# Patient Record
Sex: Male | Born: 1955 | Race: White | Hispanic: No | Marital: Married | Smoking: Never smoker
Health system: Southern US, Community
[De-identification: ages and names within clinical notes are randomized; demographics above are authoritative.]

## PROBLEM LIST (undated history)

## (undated) DIAGNOSIS — Z8719 Personal history of other diseases of the digestive system: Secondary | ICD-10-CM

## (undated) DIAGNOSIS — E119 Type 2 diabetes mellitus without complications: Secondary | ICD-10-CM

## (undated) DIAGNOSIS — E785 Hyperlipidemia, unspecified: Secondary | ICD-10-CM

## (undated) DIAGNOSIS — K436 Other and unspecified ventral hernia with obstruction, without gangrene: Secondary | ICD-10-CM

## (undated) DIAGNOSIS — G4730 Sleep apnea, unspecified: Secondary | ICD-10-CM

## (undated) DIAGNOSIS — I10 Essential (primary) hypertension: Secondary | ICD-10-CM

## (undated) DIAGNOSIS — F329 Major depressive disorder, single episode, unspecified: Secondary | ICD-10-CM

## (undated) DIAGNOSIS — F32A Depression, unspecified: Secondary | ICD-10-CM

## (undated) HISTORY — PX: SHOULDER ARTHROSCOPY: SHX128

## (undated) HISTORY — PX: HERNIA REPAIR: SHX51

## (undated) HISTORY — PX: GASTRIC BYPASS: SHX52

---

## 1998-06-22 ENCOUNTER — Encounter: Admission: RE | Admit: 1998-06-22 | Discharge: 1998-09-20 | Payer: Self-pay | Admitting: Internal Medicine

## 2000-11-14 ENCOUNTER — Emergency Department (HOSPITAL_COMMUNITY): Admission: EM | Admit: 2000-11-14 | Discharge: 2000-11-15 | Payer: Self-pay | Admitting: Emergency Medicine

## 2000-11-15 ENCOUNTER — Encounter: Payer: Self-pay | Admitting: Emergency Medicine

## 2002-11-22 ENCOUNTER — Encounter: Payer: Self-pay | Admitting: Internal Medicine

## 2002-11-22 ENCOUNTER — Encounter: Admission: RE | Admit: 2002-11-22 | Discharge: 2002-11-22 | Payer: Self-pay | Admitting: Internal Medicine

## 2003-12-12 ENCOUNTER — Ambulatory Visit (HOSPITAL_BASED_OUTPATIENT_CLINIC_OR_DEPARTMENT_OTHER): Admission: RE | Admit: 2003-12-12 | Discharge: 2003-12-12 | Payer: Self-pay | Admitting: Internal Medicine

## 2004-10-11 ENCOUNTER — Ambulatory Visit (HOSPITAL_COMMUNITY): Admission: RE | Admit: 2004-10-11 | Discharge: 2004-10-11 | Payer: Self-pay | Admitting: Internal Medicine

## 2006-01-07 ENCOUNTER — Ambulatory Visit (HOSPITAL_COMMUNITY): Admission: RE | Admit: 2006-01-07 | Discharge: 2006-01-07 | Payer: Self-pay | Admitting: Internal Medicine

## 2006-01-07 ENCOUNTER — Encounter (INDEPENDENT_AMBULATORY_CARE_PROVIDER_SITE_OTHER): Payer: Self-pay | Admitting: *Deleted

## 2007-04-06 ENCOUNTER — Encounter: Admission: RE | Admit: 2007-04-06 | Discharge: 2007-04-06 | Payer: Self-pay | Admitting: Internal Medicine

## 2008-02-03 ENCOUNTER — Encounter: Admission: RE | Admit: 2008-02-03 | Discharge: 2008-02-03 | Payer: Self-pay | Admitting: Otolaryngology

## 2008-02-03 IMAGING — CT CT NECK W/ CM
3 of 4 series · 16 of 33 positions shown, 19 images · IV contrast ([ID] OMNI [id number])
Comparison: None available.

CLINICAL DATA: Neck mass.

CT NECK WITH CONTRAST
TECHNIQUE: Multidetector CT imaging of the neck was performed with
intravenous contrast.
Contrast: 100 ml Omnipaque 300

[Series 3: soft tissue neck · axial · 0.49mm/px · z∈[+33,+217]mm · 8 of 63 slices shown, 10 images]
[im 7/63  soft-tissue]
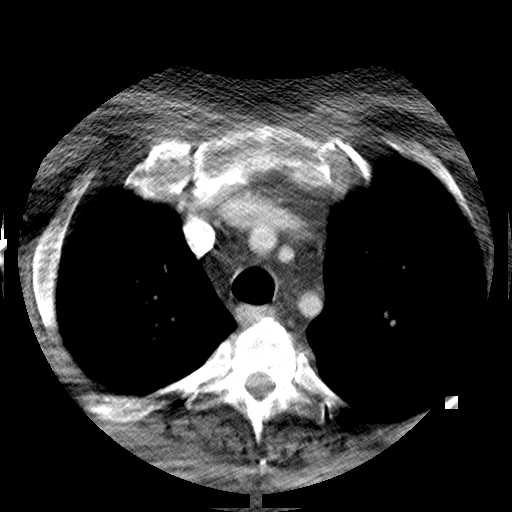
[im 7/63  bone]
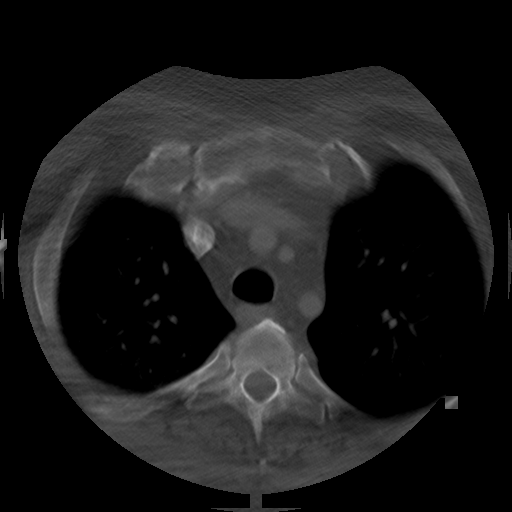
[im 14/63  bone]
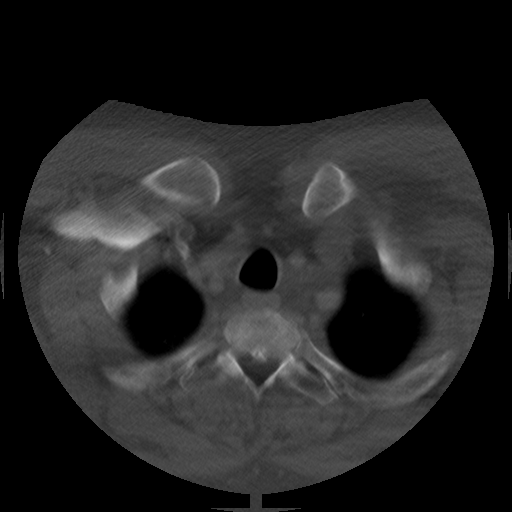
[im 21/63  bone]
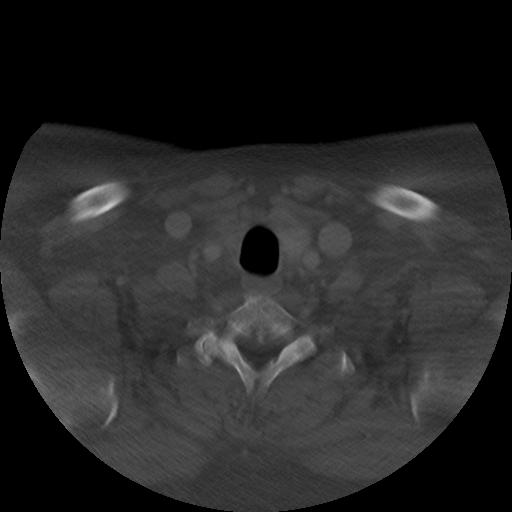
[im 28/63  bone]
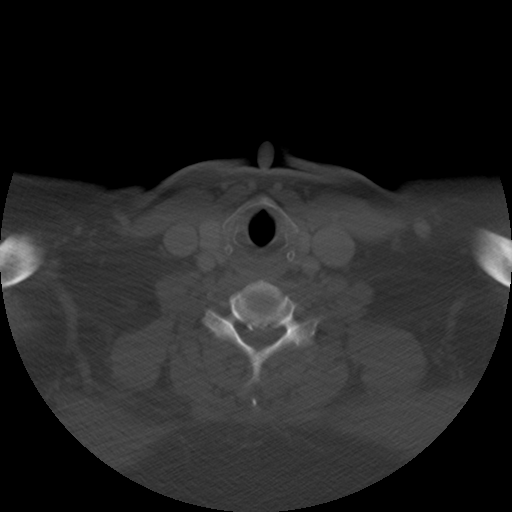
[im 35/63  soft-tissue]
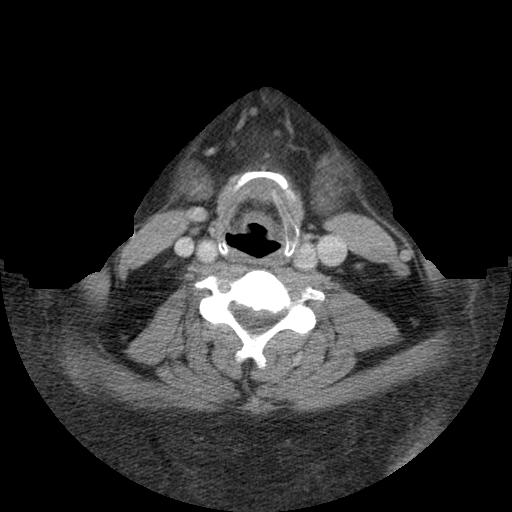
[im 35/63  bone]
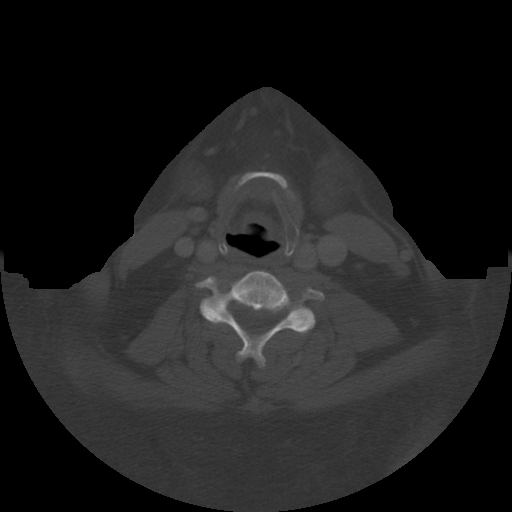
[im 42/63  bone]
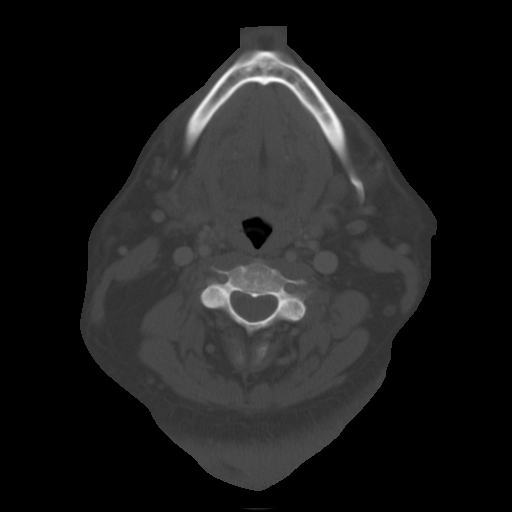
[im 49/63  bone]
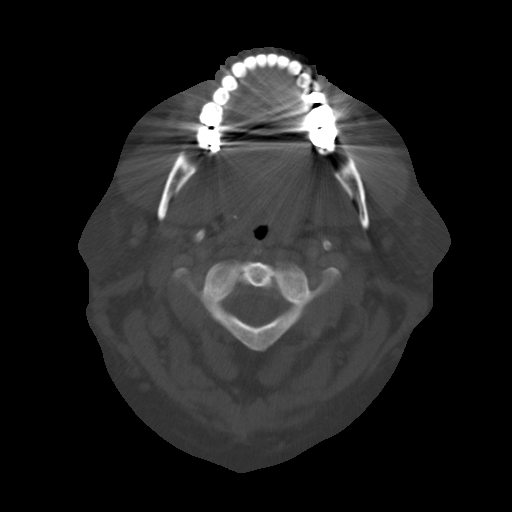
[im 56/63  bone]
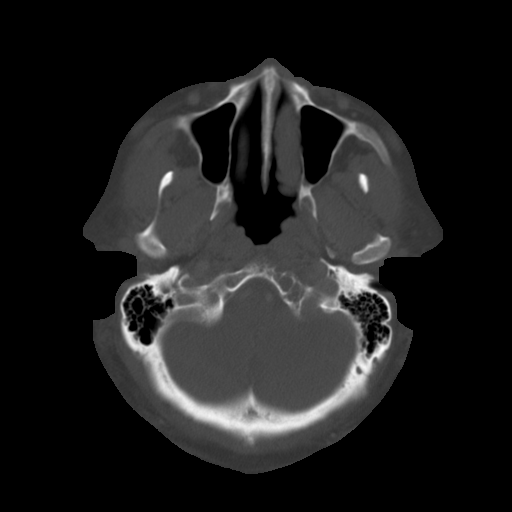

[Series 200: coronal · coronal · 0.49mm/px · 3 of 88 slices shown]
[im 22/88  bone]
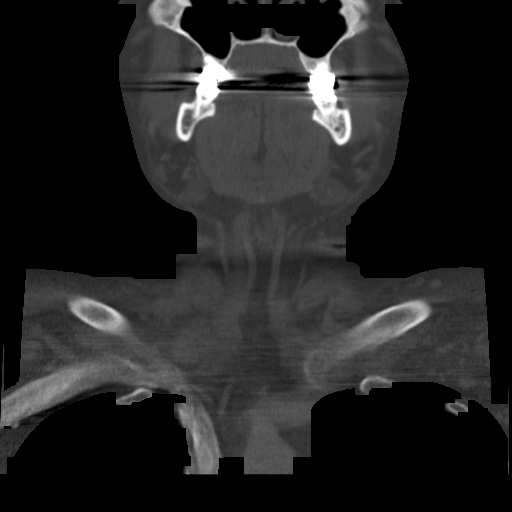
[im 37/88  bone]
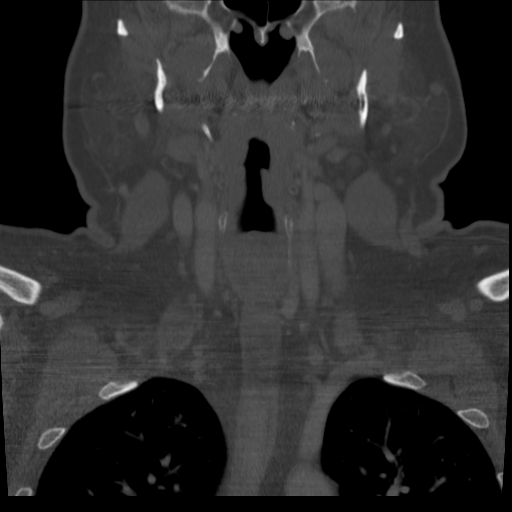
[im 51/88  bone]
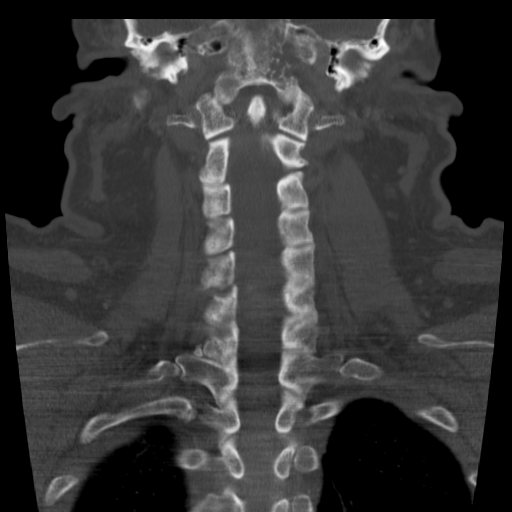

[Series 201: sagittal · sagittal · 0.49mm/px · 5 of 100 slices shown, 6 images]
[im 34/100  bone]
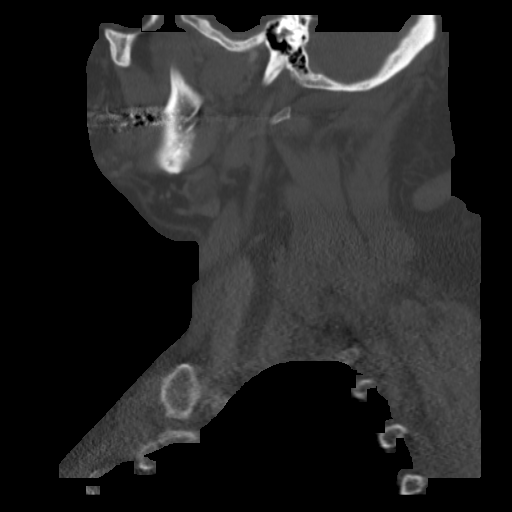
[im 42/100  bone]
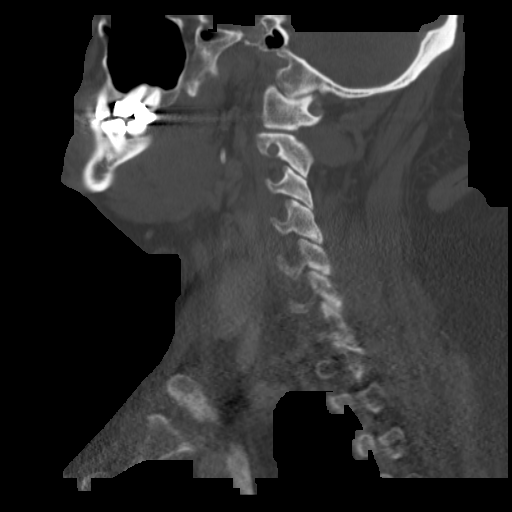
[im 50/100  soft-tissue]
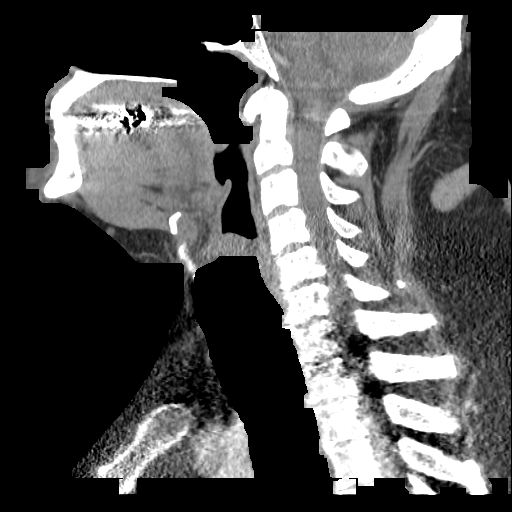
[im 50/100  bone]
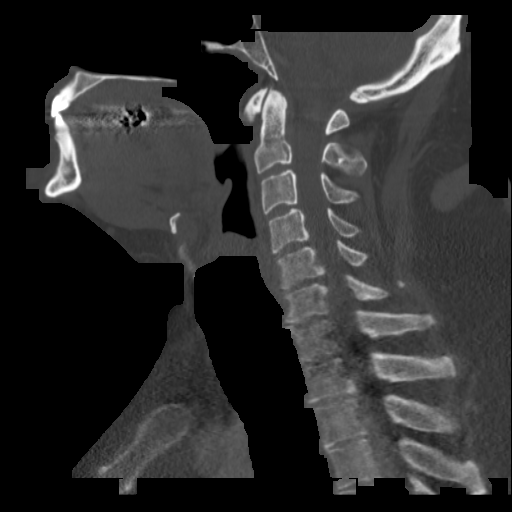
[im 58/100  bone]
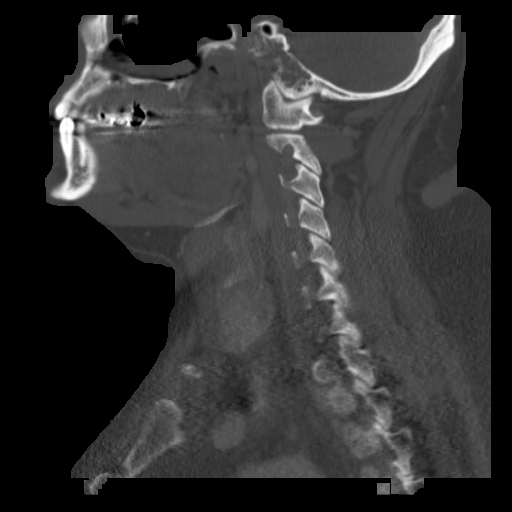
[im 67/100  bone]
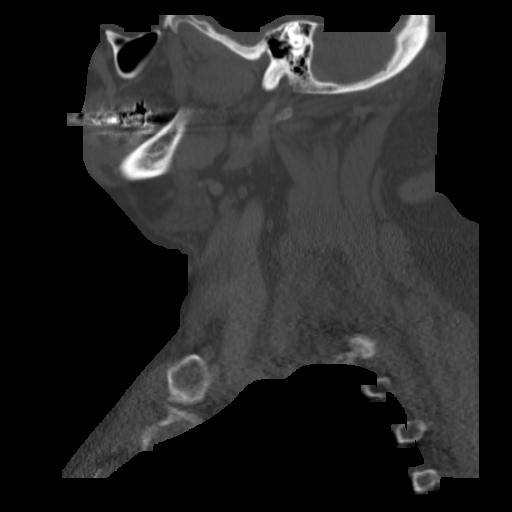

[16 of 33 positions shown; findings below may reference images not displayed]

FINDINGS: Limited imaging of the brain is unremarkable.  There is
mild prominence of the palatine tonsils with some calcifications on
the right suggesting remote disease.  No significant mucosal or
submucosal lesions are present.  The vocal cords are midline.  The
thyroid gland is within normal limits.

No pathologically enlarged lymph nodes are present in the neck.  A
2.0 x 1.6 x 1.5 centimeter fluid attenuation cystic mass is present
to just anterior and to the left of midline relative to the thyroid
cartilage.  There is no significant wall enhancement or surrounding
inflammation.  The findings are compatible with a thyroglossal duct
cyst.  Limited imaging of the lung apices is unremarkable.  Bone
windows demonstrate mild degenerative endplate changes most evident
C5-6 through T1-2. No focal lytic or blastic lesion is evident.
IMPRESSION: 1.  Left paramidline 2 cm benign appearing cystic mass at the level
of the thyroid cartilage is most compatible with a thyroglossal
duct cyst.  There are no complicating features.
2.  Mild prominence of the palatine tonsils without discrete mass.
These areas should be amendable to direct visualization.
3.  Cervical spondylosis from C5-T2.

## 2008-05-24 ENCOUNTER — Ambulatory Visit (HOSPITAL_COMMUNITY): Admission: RE | Admit: 2008-05-24 | Discharge: 2008-05-25 | Payer: Self-pay | Admitting: Otolaryngology

## 2008-05-24 ENCOUNTER — Encounter (INDEPENDENT_AMBULATORY_CARE_PROVIDER_SITE_OTHER): Payer: Self-pay | Admitting: Otolaryngology

## 2010-05-22 LAB — GLUCOSE, CAPILLARY
Glucose-Capillary: 112 mg/dL — ABNORMAL HIGH (ref 70–99)
Glucose-Capillary: 117 mg/dL — ABNORMAL HIGH (ref 70–99)
Glucose-Capillary: 240 mg/dL — ABNORMAL HIGH (ref 70–99)

## 2010-05-22 LAB — CBC
HCT: 42.1 % (ref 39.0–52.0)
Platelets: 204 10*3/uL (ref 150–400)
RBC: 4.79 MIL/uL (ref 4.22–5.81)
WBC: 8.4 10*3/uL (ref 4.0–10.5)

## 2010-05-22 LAB — BASIC METABOLIC PANEL
BUN: 12 mg/dL (ref 6–23)
Chloride: 106 mEq/L (ref 96–112)
GFR calc Af Amer: >60 mL/min (ref >60)
GFR calc non Af Amer: >60 mL/min (ref >60)
Potassium: 4.1 mEq/L (ref 3.5–5.1)
Sodium: 140 mEq/L (ref 135–145)

## 2010-06-25 NOTE — Op Note (Signed)
Gavin Sweeney, Gavin Sweeney                ACCOUNT NO.:  [id number]   MEDICAL RECORD NO.:  [id number]          PATIENT TYPE:  AMB   LOCATION:  SDS                          FACILITY:  [facility]   PHYSICIAN:  Kinnie Scales. Annalee Genta, M.D.DATE OF BIRTH:  1955-05-09   DATE OF PROCEDURE:  05/24/2008  DATE OF DISCHARGE:                               OPERATIVE REPORT   PREOPERATIVE DIAGNOSES:  1. Anterior neck mass.  2. Obstructive sleep apnea.  3. Morbid obesity.   POSTOPERATIVE DIAGNOSES:  1. Anterior neck mass.  2. Obstructive sleep apnea.  3. Morbid obesity.   INDICATIONS FOR SURGERY:  1. Anterior neck mass.  2. Obstructive sleep apnea.  3. Morbid obesity.   SURGICAL PROCEDURE:  Excision, anterior deep neck mass.   SURGEON:  Kinnie Scales. Annalee Genta, MD   ASSISTANTEnrigue Catena H. Pollyann Kennedy, MD   ANESTHESIA:  General endotracheal.   COMPLICATIONS:  None.   SPECIMENS TO PATHOLOGY:  Minimal bleeding.   DISPOSITION:  The patient transferred from the operating room to the  recovery room incision in stable condition.   BRIEF HISTORY:  The patient is a 55 year old white male with a history  of morbid obesity.  He underwent bariatric bypass surgery approximately  1 year prior to his surgical procedure and he has lost over 140 pounds.  Over time, the patient has noted an anterior neck mass which is  asymptomatic and nontender.  Evaluation in the office revealed a 3-cm  firm mass in the anterior neck adjacent to the patient's larynx.  A CT  scan was obtained which showed a heterogeneous noncystic mass involving  the anterior neck.  Findings were consistent with possible deep neck  cyst versus thyroglossal duct cyst.  Given the patient's history,  examination, and physical findings, I recommended that we undertake the  excision of the mass under general anesthesia.  The risks, benefits, and  possible complications of procedure were discussed in detail and  included the possibility of excision of the hyoid  bone for management of  thyroglossal duct cyst.  Also with the patient's obesity and sleep  apnea, I recommended inpatient overnight observation in Step-Down for  possible exacerbation of sleep apnea.  The patient and his wife  understood and concurred with our plan for surgery which was scheduled  under general anesthesia at [facility] on May 24, 2008.   PROCEDURE IN DETAIL:  The patient was brought to the operating room and  placed in supine position on the operating table.  General endotracheal  anesthesia was established without difficulty.  When the patient was  adequately anesthetized, he was positioned on the operating table and  then prepped and draped in a sterile fashion.  The skin was injected  with a total of 3 mL of 1% lidocaine 1:100,000 dilution injected in  subcutaneous fashion in the proposed skin incision.  The patient was  then positioned and a shoulder roll was placed.  The anterior neck was  thoroughly evaluated and CT scan was reviewed.   With the patient prepared for surgery, a 3-cm horizontally oriented  incision was made in  a preexisting skin crease in the soft tissue  overlying the mass.  There appeared to be a skin attachment of the mass  and this was excised in an elliptical incision and included in the  primary surgical excision.  The incision was created with a #15 scalpel  and was carried through the skin and underlying subcutaneous tissue.  Subcutaneous flaps were then elevated.  The mass was identified in the  immediate subcutaneous space.  There was significant inflammatory change  with dense adhesions which were carefully dissected from the surrounding  soft tissue.  The strap muscles were then identified and lateralized  allowing access to the anterior compartment of the neck.  The mass was  then carefully dissected from the surrounding structures including the  anterior surface of the laryngeal cartilage.  The entire mass was  resected.   There did not appear to be any cystic extension either  superiorly or inferiorly and the mass was sent to pathology for gross  and microscopic evaluation.  The wound defect was then thoroughly  irrigated with saline solution and closed in multiple layers beginning  with reapproximation of the strap muscles in the midline with a 4-0  Vicryl suture in an interrupted fashion.  Deep subcutaneous and platysma  closure was achieved with 5-0 Vicryl in interrupted fashion and final  skin closure was achieved with a 5-0 Ethilon suture in a running locked  suture.  The patient's wound was then dressed with bacitracin ointment.  He was awakened from his anesthetic, extubated, and was then transferred  from the operating room to the recovery room in stable condition.  There  were no complications and blood loss was minimal.           ______________________________  Kinnie Scales. Annalee Genta, M.D.     DLS/MEDQ  D:  16/11/9602  T:  05/25/2008  Job:  [id number]

## 2010-06-28 NOTE — Procedures (Signed)
NAMETAYLER, Gavin Sweeney                ACCOUNT NO.:  [id number]   MEDICAL RECORD NO.:  [id number]          PATIENT TYPE:  OUT   LOCATION:  SLEEP CENTER                 FACILITY:  [facility]   PHYSICIAN:  Clinton D. Maple Hudson, M.D. DATE OF BIRTH:  1955/06/05   DATE OF STUDY:  12/12/2003                              NOCTURNAL POLYSOMNOGRAM   REFERRING PHYSICIAN:  Dr. Kirby Funk   INDICATION FOR STUDY:  Hypersomnia with sleep apnea.   EPWORTH SLEEPINESS SCORE:  11/24   NECK SIZE:  22 inches   BMI:  52.   WEIGHT:  385 pounds   SLEEP ARCHITECTURE:  Total sleep time 369 minutes with sleep efficiency 82%  . Stage I was 12%, Stage II 67%, Stages III and IV 9%, REM was 12% of total  sleep time. Sleep latency 14.5 minutes, REM 275 minutes, awake after sleep  onset 67 minutes, arousal index 17.   RESPIRATORY DATA:  Split-study protocol.  RDI 24/hr, indicating moderate  obstructive sleep apnea/hypopnea syndrome before CPAP. This included 3  obstructive apneas and 61 hypopneas before CPAP.  Events were most  pronounced while supine, but seen in all sleeping positions. REM RDI was  42/hr. CPAP was titrated to 13 CWP, RDI 2.3/hr using a large Respironics  ComfortGel Mask with heated humidifier.   OXYGEN DATA:  Very loud snoring with moderate oxygen desaturation to a nadir  of 76% before CPAP.  After CPAP control, saturation held 90-96% on room air.   CARDIAC DATA:  Normal sinus rhythm.   MOVEMENT/PARASOMNIA:  Occasional leg jerk with insignificant impact on  sleep.   IMPRESSION/RECOMMENDATION:  Moderate obstructive sleep apnea/hypopnea  syndrome, respiratory disturbance index 24/hr with desaturation to 76%. CPAP  titration to 13 CWP, respiratory disturbance index 2.3/hr, using a large  Respironics ComfortGel Mask with heated humidifier.                                                           Clinton D. Maple Hudson, M.D.  Diplomate, [employer] Board   CDY/MEDQ  D:  12/17/2003 10:45:17  T:   12/18/2003 10:09:05  Job:  [id number]

## 2012-03-29 ENCOUNTER — Ambulatory Visit
Admission: RE | Admit: 2012-03-29 | Discharge: 2012-03-29 | Disposition: A | Discharge status: Home / Self Care | Payer: BC Managed Care – PPO | Source: Ambulatory Visit | Attending: Internal Medicine | Admitting: Internal Medicine

## 2012-03-29 ENCOUNTER — Other Ambulatory Visit: Payer: Self-pay | Admitting: Internal Medicine

## 2012-03-29 DIAGNOSIS — R609 Edema, unspecified: Secondary | ICD-10-CM

## 2012-03-29 DIAGNOSIS — M79606 Pain in leg, unspecified: Secondary | ICD-10-CM

## 2014-06-01 ENCOUNTER — Other Ambulatory Visit: Payer: Self-pay | Admitting: Internal Medicine

## 2014-06-01 DIAGNOSIS — M79604 Pain in right leg: Secondary | ICD-10-CM

## 2014-06-01 DIAGNOSIS — R609 Edema, unspecified: Secondary | ICD-10-CM

## 2014-06-02 ENCOUNTER — Ambulatory Visit
Admission: RE | Admit: 2014-06-02 | Discharge: 2014-06-02 | Disposition: A | Discharge status: Home / Self Care | Payer: BLUE CROSS/BLUE SHIELD | Source: Ambulatory Visit | Attending: Internal Medicine | Admitting: Internal Medicine

## 2014-06-02 DIAGNOSIS — M79604 Pain in right leg: Secondary | ICD-10-CM

## 2014-06-02 DIAGNOSIS — R609 Edema, unspecified: Secondary | ICD-10-CM

## 2016-02-13 DIAGNOSIS — E113293 Type 2 diabetes mellitus with mild nonproliferative diabetic retinopathy without macular edema, bilateral: Secondary | ICD-10-CM | POA: Diagnosis not present

## 2016-05-05 DIAGNOSIS — Z125 Encounter for screening for malignant neoplasm of prostate: Secondary | ICD-10-CM | POA: Diagnosis not present

## 2016-05-05 DIAGNOSIS — Z9884 Bariatric surgery status: Secondary | ICD-10-CM | POA: Diagnosis not present

## 2016-05-05 DIAGNOSIS — R0789 Other chest pain: Secondary | ICD-10-CM | POA: Diagnosis not present

## 2016-05-05 DIAGNOSIS — I10 Essential (primary) hypertension: Secondary | ICD-10-CM | POA: Diagnosis not present

## 2016-05-05 DIAGNOSIS — E7800 Pure hypercholesterolemia, unspecified: Secondary | ICD-10-CM | POA: Diagnosis not present

## 2016-05-05 DIAGNOSIS — E113293 Type 2 diabetes mellitus with mild nonproliferative diabetic retinopathy without macular edema, bilateral: Secondary | ICD-10-CM | POA: Diagnosis not present

## 2016-05-05 DIAGNOSIS — Z0000 Encounter for general adult medical examination without abnormal findings: Secondary | ICD-10-CM | POA: Diagnosis not present

## 2016-05-07 ENCOUNTER — Other Ambulatory Visit: Payer: Self-pay | Admitting: Internal Medicine

## 2016-05-07 DIAGNOSIS — R9431 Abnormal electrocardiogram [ECG] [EKG]: Secondary | ICD-10-CM

## 2016-05-21 ENCOUNTER — Other Ambulatory Visit (HOSPITAL_COMMUNITY): Payer: BLUE CROSS/BLUE SHIELD

## 2016-05-30 ENCOUNTER — Ambulatory Visit (HOSPITAL_COMMUNITY): Payer: 59 | Attending: Cardiology

## 2016-05-30 ENCOUNTER — Other Ambulatory Visit: Payer: Self-pay

## 2016-05-30 DIAGNOSIS — R9431 Abnormal electrocardiogram [ECG] [EKG]: Secondary | ICD-10-CM | POA: Diagnosis not present

## 2016-05-30 DIAGNOSIS — I5030 Unspecified diastolic (congestive) heart failure: Secondary | ICD-10-CM | POA: Insufficient documentation

## 2016-05-30 DIAGNOSIS — I340 Nonrheumatic mitral (valve) insufficiency: Secondary | ICD-10-CM | POA: Diagnosis not present

## 2016-05-30 DIAGNOSIS — I420 Dilated cardiomyopathy: Secondary | ICD-10-CM | POA: Diagnosis not present

## 2016-05-30 LAB — ECHOCARDIOGRAM COMPLETE
Ao-asc: 35 cm
CHL CUP DOP CALC LVOT VTI: 24.8 cm
CHL CUP MV DEC (S): 201
CHL CUP TV REG PEAK VELOCITY: 238 cm/s
E decel time: 201 msec
EERAT: 10.5
FS: 42 % (ref 28–44)
IV/PV OW: 0.96
LA diam index: 1.89 cm/m2
LA vol index: 18.8 mL/m2
LA vol: 50.7 mL
LASIZE: 51 mm
LAVOLA4C: 38.7 mL
LEFT ATRIUM END SYS DIAM: 51 mm
LV E/e'average: 10.5
LV TDI E'MEDIAL: 7.83
LVEEMED: 10.5
LVELAT: 9.14 cm/s
LVOT peak vel: 99 cm/s
Lateral S' vel: 11.1 cm/s
MV Peak grad: 4 mmHg
MV pk E vel: 96 m/s
MVPKAVEL: 116 m/s
PW: 10.8 mm — AB (ref 0.6–1.1)
TAPSE: 24.6 mm
TDI e' lateral: 9.14
TR max vel: 238 cm/s

## 2016-05-30 MED ORDER — PERFLUTREN LIPID MICROSPHERE
1.0000 mL | INTRAVENOUS | Status: AC | PRN
  Administered 2016-05-30: 2 mL via INTRAVENOUS

## 2016-08-27 DIAGNOSIS — F5109 Other insomnia not due to a substance or known physiological condition: Secondary | ICD-10-CM | POA: Diagnosis not present

## 2016-08-27 DIAGNOSIS — E119 Type 2 diabetes mellitus without complications: Secondary | ICD-10-CM | POA: Diagnosis not present

## 2016-08-27 DIAGNOSIS — E1165 Type 2 diabetes mellitus with hyperglycemia: Secondary | ICD-10-CM | POA: Diagnosis not present

## 2016-08-27 DIAGNOSIS — I10 Essential (primary) hypertension: Secondary | ICD-10-CM | POA: Diagnosis not present

## 2016-09-19 DIAGNOSIS — G4733 Obstructive sleep apnea (adult) (pediatric): Secondary | ICD-10-CM | POA: Diagnosis not present

## 2016-11-03 DIAGNOSIS — J302 Other seasonal allergic rhinitis: Secondary | ICD-10-CM | POA: Diagnosis not present

## 2016-11-03 DIAGNOSIS — J0110 Acute frontal sinusitis, unspecified: Secondary | ICD-10-CM | POA: Diagnosis not present

## 2016-12-10 DIAGNOSIS — Z1211 Encounter for screening for malignant neoplasm of colon: Secondary | ICD-10-CM | POA: Diagnosis not present

## 2016-12-11 DIAGNOSIS — K429 Umbilical hernia without obstruction or gangrene: Secondary | ICD-10-CM | POA: Diagnosis not present

## 2016-12-23 DIAGNOSIS — G4733 Obstructive sleep apnea (adult) (pediatric): Secondary | ICD-10-CM | POA: Diagnosis not present

## 2016-12-25 ENCOUNTER — Other Ambulatory Visit: Payer: Self-pay | Admitting: General Surgery

## 2016-12-25 DIAGNOSIS — K436 Other and unspecified ventral hernia with obstruction, without gangrene: Secondary | ICD-10-CM | POA: Diagnosis not present

## 2016-12-25 DIAGNOSIS — Z9884 Bariatric surgery status: Secondary | ICD-10-CM | POA: Diagnosis not present

## 2016-12-26 ENCOUNTER — Other Ambulatory Visit: Payer: Self-pay | Admitting: General Surgery

## 2016-12-26 DIAGNOSIS — K436 Other and unspecified ventral hernia with obstruction, without gangrene: Secondary | ICD-10-CM

## 2017-01-05 ENCOUNTER — Ambulatory Visit
Admission: RE | Admit: 2017-01-05 | Discharge: 2017-01-05 | Disposition: A | Discharge status: Home / Self Care | Payer: 59 | Source: Ambulatory Visit | Attending: General Surgery | Admitting: General Surgery

## 2017-01-05 DIAGNOSIS — K436 Other and unspecified ventral hernia with obstruction, without gangrene: Secondary | ICD-10-CM

## 2017-01-05 DIAGNOSIS — K439 Ventral hernia without obstruction or gangrene: Secondary | ICD-10-CM | POA: Diagnosis not present

## 2017-01-05 MED ORDER — IOPAMIDOL (ISOVUE-300) INJECTION 61%
125.0000 mL | Freq: Once | INTRAVENOUS | Status: AC | PRN
  Administered 2017-01-05: 125 mL via INTRAVENOUS

## 2017-01-08 DIAGNOSIS — Z1211 Encounter for screening for malignant neoplasm of colon: Secondary | ICD-10-CM | POA: Diagnosis not present

## 2017-01-08 DIAGNOSIS — K635 Polyp of colon: Secondary | ICD-10-CM | POA: Diagnosis not present

## 2017-01-08 DIAGNOSIS — K5730 Diverticulosis of large intestine without perforation or abscess without bleeding: Secondary | ICD-10-CM | POA: Diagnosis not present

## 2017-01-13 DIAGNOSIS — R9341 Abnormal radiologic findings on diagnostic imaging of renal pelvis, ureter, or bladder: Secondary | ICD-10-CM | POA: Diagnosis not present

## 2017-01-13 DIAGNOSIS — E119 Type 2 diabetes mellitus without complications: Secondary | ICD-10-CM | POA: Diagnosis not present

## 2017-01-13 DIAGNOSIS — M6208 Separation of muscle (nontraumatic), other site: Secondary | ICD-10-CM | POA: Diagnosis not present

## 2017-01-13 DIAGNOSIS — K436 Other and unspecified ventral hernia with obstruction, without gangrene: Secondary | ICD-10-CM | POA: Diagnosis not present

## 2017-01-19 DIAGNOSIS — G4733 Obstructive sleep apnea (adult) (pediatric): Secondary | ICD-10-CM | POA: Diagnosis not present

## 2017-01-23 NOTE — Pre-Procedure Instructions (Signed)
Gavin Sweeney  01/23/2017      [id number] [place], [place] - [address] [place] [address] Phone: [phone] Fax: [phone]    Your procedure is scheduled on Dec 18  [facility].M.  Call this number if you have problems the morning of surgery:  [phone]   Remember:  Do not eat food or drink liquids after midnight.  Take these medicines the morning of surgery with A SIP OF WATER Tylenol, bupropion (wellbutrin), Flonase nasal spray if needed,   Stop taking aspirin, BC's, Goody's, Herbal medications, Fish Oil, Ibuprofen, Advil, Motrin, Aleve, Vitamins    How to Manage Your Diabetes Before and After Surgery  Why is it important to control my blood sugar before and after surgery? . Improving blood sugar levels before and after surgery helps healing and can limit problems. . A way of improving blood sugar control is eating a healthy diet by: o  Eating less sugar and carbohydrates o  Increasing activity/exercise o  Talking with your doctor about reaching your blood sugar goals . High blood sugars (greater than 180 mg/dL) can raise your risk of infections and slow your recovery, so you will need to focus on controlling your diabetes during the weeks before surgery. . Make sure that the doctor who takes care of your diabetes knows about your planned surgery including the date and location.  How do I manage my blood sugar before surgery? . Check your blood sugar at least 4 times a day, starting 2 days before surgery, to make sure that the level is not too high or low. o Check your blood sugar the morning of your surgery when you wake up and every 2 hours until you get to the Short Stay unit. . If your blood sugar is less than 70 mg/dL, you will need to treat for low blood sugar: o Do not take insulin. o Treat a low blood sugar (less than 70 mg/dL) with  cup of clear juice (cranberry or apple), 4  glucose tablets, OR glucose gel. Recheck blood sugar in 15 minutes after treatment (to make sure it is greater than 70 mg/dL). If your blood sugar is not greater than 70 mg/dL on recheck, call [phone] o  for further instructions. . Report your blood sugar to the short stay nurse when you get to Short Stay.  . If you are admitted to the hospital after surgery: o Your blood sugar will be checked by the staff and you will probably be given insulin after surgery (instead of oral diabetes medicines) to make sure you have good blood sugar levels. o The goal for blood sugar control after surgery is 80-180 mg/dL.              WHAT DO I DO ABOUT MY DIABETES MEDICATION?   Marland Kitchen. Do not take oral diabetes medicines (pills) the morning of surgery. Jardiance and Metformin (Glucophage)   . THE NIGHT BEFORE SURGERY, take ___________ units of ___________insulin.       . THE MORNING OF SURGERY, take _____________ units of __________insulin.  . The day of surgery, do not take other diabetes injectables, including Byetta (exenatide), Bydureon (exenatide ER), Victoza (liraglutide), or Trulicity (dulaglutide).  . If your CBG is greater than 220 mg/dL, you may take  of your sliding scale (correction) dose of insulin.  Other Instructions:          Patient Signature:  Date:  Nurse Signature:  Date:   Reviewed and Endorsed by [facility] Patient Education Committee, August 2015  Do not wear jewelry, make-up or nail polish.  Do not wear lotions, powders, or perfumes, or deodorant.  Do not shave 48 hours prior to surgery.  Men may shave face and neck.  Do not bring valuables to the hospital.  [facility] is not responsible for any belongings or valuables.  Contacts, dentures or bridgework may not be worn into surgery.  Leave your suitcase in the car.  After surgery it may be brought to your room.  For patients admitted to the hospital, discharge time will be determined by your treatment  team.  Patients discharged the day of surgery will not be allowed to drive home.    Special instructions:  [facility] - Preparing for Surgery  Before surgery, you can play an important role.  Because skin is not sterile, your skin needs to be as free of germs as possible.  You can reduce the number of germs on you skin by washing with CHG (chlorahexidine gluconate) soap before surgery.  CHG is an antiseptic cleaner which kills germs and bonds with the skin to continue killing germs even after washing.  Please DO NOT use if you have an allergy to CHG or antibacterial soaps.  If your skin becomes reddened/irritated stop using the CHG and inform your nurse when you arrive at Short Stay.  Do not shave (including legs and underarms) for at least 48 hours prior to the first CHG shower.  You may shave your face.  Please follow these instructions carefully:   1.  Shower with CHG Soap the night before surgery and the   morning of Surgery.  2.  If you choose to wash your hair, wash your hair first as usual with your  normal shampoo.  3.  After you shampoo, rinse your hair and body thoroughly to remove the Shampoo.  4.  Use CHG as you would any other liquid soap.  You can apply chg directly to the skin and wash gently with scrungie or a clean washcloth.  5.  Apply the CHG Soap to your body ONLY FROM THE NECK DOWN.  Do not use on open wounds or open sores.  Avoid contact with your eyes,       ears, mouth and genitals (private parts).  Wash genitals (private parts)  with your normal soap.  6.  Wash thoroughly, paying special attention to the area where your surgery will be performed.  7.  Thoroughly rinse your body with warm water from the neck down.  8.  DO NOT shower/wash with your normal soap after using and rinsing off  the CHG Soap.  9.  Pat yourself dry with a clean towel.            10.  Wear clean pajamas.            11.  Place clean sheets on your bed the night of your first shower and do not   sleep with pets.  Day of Surgery  Do not apply any lotions/deoderants the morning of surgery.  Please wear clean clothes to the hospital/surgery center.     Please read over the following fact sheets that you were given. Pain Booklet, Coughing and Deep Breathing and Surgical Site Infection Prevention

## 2017-01-25 NOTE — H&P (Signed)
Gavin Sweeney 01/13/2017 2:23 PM Location: [facility] Patient #: [id number] DOB: 09-06-1955 Married / Language: English / Race: White Male   History of Present Illness       The patient is a 61 year old male who presents with an incisional hernia. This is a pleasant 61 year old gentleman. His PCP is Dr. Kirby FunkJohn Griffin who referred him. He is scheduled for laparoscopic repair of incarcerated ventral hernia with mesh on December 18.     I saw him 3 weeks ago. History of gastric bypass in [place] 2009. He lost 150 pounds but has gained some of that back. He said he had a small hernia at his umbilicus but it was not repaired and is now gotten bigger and he cannot completely reduce it within it. Occasional pain. Not bad. No nausea. He also has a large diastasis recti. He had a son that had to be operated urgently because of incarcerated hernia      CT scan was performed and shows a small periumbilical incisional hernia containing fat only. He has a couple of air bubbles in his bladder but he has no urinary tract symptoms. No history of catheterization. Small 9 millimeter lesion in right hepatic lobe follow-up in 6 months recommended. Gallstones, which are asymptomatic. I discussed the CT scan with him in detail      We talked about his diastases recti and his hernia. We talked about repair now or later after significant weight loss. He says it would probably take him 3 years to lose 100 pounds. He wants to go ahead now. He does is an increased risk of recurrence.      Comorbidities include history gastric bypass 2009. BMI 45. Non-insulin-dependent diabetes. Hypertension. Sleep apnea. UTIs. Erectile dysfunction. Diastases recti. Does not smoke. Exostosis reveals that his wife is an Charity fundraiserN. [place] has 2 sons. 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 Denies alcohol or tobacco.       We had a long discussion about the indications, details,  techniques, and numerous risk of the surgery. He understands all these issues. My thought is that we will start out laparoscopically. May have to make a small puncture wound over the hernia sac to bring the fascia together with some sutures and then inlay mesh. Always a chance of conversion to open. Always a chance of complications. I advised against cholecystectomy since he is asymptomatic and that would make this a class II wound. He understands that.      because of the air bubbles in his bladder we going to send him downstairs now for a clean catch urinalysis and clean-catch urine culture.   Addendum Note Urinalysis shows cloudy urine with bacteria. Looks like another UTI Culture results show klebsiella  We called in a 10 day supply of Bactrim DS 1 twice a day   Allergies  Tessalon Perles *COUGH/COLD/ALLERGY*  hallucinations Allergies Reconciled   Medication History  Amlodipine Besy-Benazepril HCl (10-40MG  Capsule, Oral) Active. BuPROPion HCl ER (XL) (300MG  Tablet ER 24HR, Oral) Active. Jardiance (10MG  Tablet, Oral) Active. MetFORMIN HCl (500MG  Tablet, Oral) Active. Multivitamins (Oral) Active. B Complex 1 (Oral) Active. Vitamin B12 (100MCG Tablet, 50 mg half tab Oral) Active. Vitamin D3 (2000UNIT Tablet, Oral) Active. Biotin 5000 (5MG  Capsule, Oral) Active. Colace (100MG  Capsule, Oral) Active. Medications Reconciled  Vitals  Weight: 338 lb Height: 72in Body Surface Area: 2.66 m Body Mass Index: 45.84 kg/m  Temp.: 98.34F  Pulse: 75 (Regular)  BP: 138/72 (Sitting, Left Arm, Standard)  Physical Exam  General Mental Status-Alert. General Appearance-Not in acute distress. Build & Nutrition-Well nourished. Posture-Normal posture. Gait-Normal.  Head and Neck Head-normocephalic, atraumatic with no lesions or palpable masses. Trachea-midline. Thyroid Gland Characteristics - normal size and consistency and no palpable  nodules.  Chest and Lung Exam Chest and lung exam reveals -on auscultation, normal breath sounds, no adventitious sounds and normal vocal resonance.  Cardiovascular Cardiovascular examination reveals -normal heart sounds, regular rate and rhythm with no murmurs and femoral artery auscultation bilaterally reveals normal pulses, no bruits, no thrills.  Abdomen Note: Obese. Soft. Nontender. Significant diastases recti from umbilicus to xiphoid. Smaller hernia and periumbilical area. No other hernias detected.   Neurologic Neurologic evaluation reveals -alert and oriented x 3 with no impairment of recent or remote memory, normal attention span and ability to concentrate, normal sensation and normal coordination.  Musculoskeletal Normal Exam - Bilateral-Upper Extremity Strength Normal and Lower Extremity Strength Normal.    Assessment & Plan  VENTRAL HERNIA WITH OBSTRUCTION AND WITHOUT GANGRENE (K43.6)  your CT scan showed a small ventral hernia containing fatty tissue There was a small amount of air in your bladder but you do not have any significant bladder symptoms You are scheduled for laparoscopic repair of your ventral and hernia on December 18. We plan to use mesh to repair the hernia as we discussed. 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 Derrell LollingIngram, M.D., [facility] General and Minimally invasive Surgery Breast and Colorectal Surgery Office:   [phone] Pager:   [phone]

## 2017-01-26 ENCOUNTER — Encounter (HOSPITAL_COMMUNITY): Payer: Self-pay

## 2017-01-26 ENCOUNTER — Encounter (HOSPITAL_COMMUNITY)
Admission: RE | Admit: 2017-01-26 | Discharge: 2017-01-26 | Disposition: A | Discharge status: Home / Self Care | Payer: 59 | Source: Ambulatory Visit | Attending: General Surgery | Admitting: General Surgery

## 2017-01-26 DIAGNOSIS — Z6841 Body Mass Index (BMI) 40.0 and over, adult: Secondary | ICD-10-CM | POA: Diagnosis not present

## 2017-01-26 DIAGNOSIS — Z87440 Personal history of urinary (tract) infections: Secondary | ICD-10-CM | POA: Diagnosis not present

## 2017-01-26 DIAGNOSIS — E669 Obesity, unspecified: Secondary | ICD-10-CM | POA: Diagnosis not present

## 2017-01-26 DIAGNOSIS — E119 Type 2 diabetes mellitus without complications: Secondary | ICD-10-CM | POA: Diagnosis not present

## 2017-01-26 DIAGNOSIS — I10 Essential (primary) hypertension: Secondary | ICD-10-CM | POA: Diagnosis not present

## 2017-01-26 DIAGNOSIS — N390 Urinary tract infection, site not specified: Secondary | ICD-10-CM | POA: Diagnosis not present

## 2017-01-26 DIAGNOSIS — Z9884 Bariatric surgery status: Secondary | ICD-10-CM | POA: Diagnosis not present

## 2017-01-26 DIAGNOSIS — Z7984 Long term (current) use of oral hypoglycemic drugs: Secondary | ICD-10-CM | POA: Diagnosis not present

## 2017-01-26 DIAGNOSIS — G4730 Sleep apnea, unspecified: Secondary | ICD-10-CM | POA: Diagnosis not present

## 2017-01-26 DIAGNOSIS — N529 Male erectile dysfunction, unspecified: Secondary | ICD-10-CM | POA: Diagnosis not present

## 2017-01-26 DIAGNOSIS — B961 Klebsiella pneumoniae [K. pneumoniae] as the cause of diseases classified elsewhere: Secondary | ICD-10-CM | POA: Diagnosis not present

## 2017-01-26 DIAGNOSIS — Z79899 Other long term (current) drug therapy: Secondary | ICD-10-CM | POA: Diagnosis not present

## 2017-01-26 DIAGNOSIS — K436 Other and unspecified ventral hernia with obstruction, without gangrene: Secondary | ICD-10-CM | POA: Diagnosis not present

## 2017-01-26 HISTORY — DX: Major depressive disorder, single episode, unspecified: F32.9

## 2017-01-26 HISTORY — DX: Type 2 diabetes mellitus without complications: E11.9

## 2017-01-26 HISTORY — DX: Essential (primary) hypertension: I10

## 2017-01-26 HISTORY — DX: Sleep apnea, unspecified: G47.30

## 2017-01-26 HISTORY — DX: Personal history of other diseases of the digestive system: Z87.19

## 2017-01-26 HISTORY — DX: Depression, unspecified: F32.A

## 2017-01-26 LAB — COMPREHENSIVE METABOLIC PANEL
ALBUMIN: 3.8 g/dL (ref 3.5–5.0)
ALK PHOS: 73 U/L (ref 38–126)
ALT: 37 U/L (ref 17–63)
ANION GAP: 11 (ref 5–15)
AST: 35 U/L (ref 15–41)
BUN: 13 mg/dL (ref 6–20)
CHLORIDE: 104 mmol/L (ref 101–111)
CO2: 23 mmol/L (ref 22–32)
Calcium: 9.1 mg/dL (ref 8.9–10.3)
Creatinine, Ser: 0.79 mg/dL (ref 0.61–1.24)
GFR calc Af Amer: >60 mL/min (ref >60)
GFR calc non Af Amer: >60 mL/min (ref >60)
GLUCOSE: 137 mg/dL — AB (ref 65–99)
POTASSIUM: 4 mmol/L (ref 3.5–5.1)
SODIUM: 138 mmol/L (ref 135–145)
Total Bilirubin: 0.9 mg/dL (ref 0.3–1.2)
Total Protein: 6.5 g/dL (ref 6.5–8.1)

## 2017-01-26 LAB — CBC WITH DIFFERENTIAL/PLATELET
BASOS PCT: 0 %
Basophils Absolute: 0 10*3/uL (ref 0.0–0.1)
EOS ABS: 0.2 10*3/uL (ref 0.0–0.7)
Eosinophils Relative: 3 %
HCT: 44.5 % (ref 39.0–52.0)
HEMOGLOBIN: 14.7 g/dL (ref 13.0–17.0)
Lymphocytes Relative: 35 %
Lymphs Abs: 2.9 10*3/uL (ref 0.7–4.0)
MCH: 29.5 pg (ref 26.0–34.0)
MCHC: 33 g/dL (ref 30.0–36.0)
MCV: 89.2 fL (ref 78.0–100.0)
MONOS PCT: 12 %
Monocytes Absolute: 1 10*3/uL (ref 0.1–1.0)
NEUTROS PCT: 50 %
Neutro Abs: 4.3 10*3/uL (ref 1.7–7.7)
PLATELETS: 225 10*3/uL (ref 150–400)
RBC: 4.99 MIL/uL (ref 4.22–5.81)
RDW: 13.4 % (ref 11.5–15.5)
WBC: 8.4 10*3/uL (ref 4.0–10.5)

## 2017-01-26 LAB — GLUCOSE, CAPILLARY: Glucose-Capillary: 152 mg/dL — ABNORMAL HIGH (ref 65–99)

## 2017-01-26 MED ORDER — CHLORHEXIDINE GLUCONATE CLOTH 2 % EX PADS: 6.0000 | MEDICATED_PAD | Freq: Once | CUTANEOUS | Status: DC

## 2017-01-26 MED ORDER — ACETAMINOPHEN 500 MG PO TABS
1000.0000 mg | ORAL_TABLET | ORAL | Status: DC
  Filled 2017-01-26: qty 2

## 2017-01-26 MED ORDER — GABAPENTIN 300 MG PO CAPS
300.0000 mg | ORAL_CAPSULE | ORAL | Status: AC
  Administered 2017-01-27: 300 mg via ORAL
  Filled 2017-01-26: qty 1

## 2017-01-26 MED ORDER — CELECOXIB 200 MG PO CAPS
200.0000 mg | ORAL_CAPSULE | ORAL | Status: AC
  Administered 2017-01-27: 200 mg via ORAL
  Filled 2017-01-26: qty 1

## 2017-01-26 MED ORDER — DEXTROSE 5 % IV SOLN
3.0000 g | INTRAVENOUS | Status: AC
  Administered 2017-01-27: 3 g via INTRAVENOUS
  Filled 2017-01-26: qty 3

## 2017-01-27 ENCOUNTER — Ambulatory Visit (HOSPITAL_COMMUNITY): Payer: 59 | Admitting: Anesthesiology

## 2017-01-27 ENCOUNTER — Encounter (HOSPITAL_COMMUNITY)
Admission: RE | Disposition: A | Discharge status: Home / Self Care | Payer: Self-pay | Source: Ambulatory Visit | Attending: General Surgery

## 2017-01-27 ENCOUNTER — Ambulatory Visit (HOSPITAL_COMMUNITY)
Admission: RE | Admit: 2017-01-27 | Discharge: 2017-01-28 | Disposition: A | Discharge status: Home / Self Care | Payer: 59 | Source: Ambulatory Visit | Attending: General Surgery | Admitting: General Surgery

## 2017-01-27 ENCOUNTER — Encounter (HOSPITAL_COMMUNITY): Payer: Self-pay | Admitting: *Deleted

## 2017-01-27 ENCOUNTER — Other Ambulatory Visit: Payer: Self-pay

## 2017-01-27 DIAGNOSIS — N390 Urinary tract infection, site not specified: Secondary | ICD-10-CM | POA: Insufficient documentation

## 2017-01-27 DIAGNOSIS — E119 Type 2 diabetes mellitus without complications: Secondary | ICD-10-CM | POA: Diagnosis not present

## 2017-01-27 DIAGNOSIS — Z6841 Body Mass Index (BMI) 40.0 and over, adult: Secondary | ICD-10-CM | POA: Insufficient documentation

## 2017-01-27 DIAGNOSIS — Z7984 Long term (current) use of oral hypoglycemic drugs: Secondary | ICD-10-CM | POA: Insufficient documentation

## 2017-01-27 DIAGNOSIS — K436 Other and unspecified ventral hernia with obstruction, without gangrene: Secondary | ICD-10-CM | POA: Diagnosis not present

## 2017-01-27 DIAGNOSIS — N529 Male erectile dysfunction, unspecified: Secondary | ICD-10-CM | POA: Insufficient documentation

## 2017-01-27 DIAGNOSIS — Z9884 Bariatric surgery status: Secondary | ICD-10-CM | POA: Insufficient documentation

## 2017-01-27 DIAGNOSIS — I10 Essential (primary) hypertension: Secondary | ICD-10-CM | POA: Insufficient documentation

## 2017-01-27 DIAGNOSIS — G4730 Sleep apnea, unspecified: Secondary | ICD-10-CM | POA: Insufficient documentation

## 2017-01-27 DIAGNOSIS — B961 Klebsiella pneumoniae [K. pneumoniae] as the cause of diseases classified elsewhere: Secondary | ICD-10-CM | POA: Insufficient documentation

## 2017-01-27 DIAGNOSIS — Z87440 Personal history of urinary (tract) infections: Secondary | ICD-10-CM | POA: Insufficient documentation

## 2017-01-27 DIAGNOSIS — Z79899 Other long term (current) drug therapy: Secondary | ICD-10-CM | POA: Insufficient documentation

## 2017-01-27 DIAGNOSIS — E669 Obesity, unspecified: Secondary | ICD-10-CM | POA: Insufficient documentation

## 2017-01-27 HISTORY — DX: Other and unspecified ventral hernia with obstruction, without gangrene: K43.6

## 2017-01-27 HISTORY — PX: LAPAROSCOPIC ASSISTED VENTRAL HERNIA REPAIR: SHX6312

## 2017-01-27 HISTORY — PX: INSERTION OF MESH: SHX5868

## 2017-01-27 HISTORY — DX: Morbid (severe) obesity due to excess calories: E66.01

## 2017-01-27 HISTORY — DX: Hyperlipidemia, unspecified: E78.5

## 2017-01-27 LAB — HEMOGLOBIN A1C
Hgb A1c MFr Bld: 7.2 % — ABNORMAL HIGH (ref 4.8–5.6)
Mean Plasma Glucose: 160 mg/dL

## 2017-01-27 LAB — CREATININE, SERUM: CREATININE: 0.75 mg/dL (ref 0.61–1.24)

## 2017-01-27 LAB — CBC
HCT: 42.3 % (ref 39.0–52.0)
HEMOGLOBIN: 14.1 g/dL (ref 13.0–17.0)
MCH: 29.6 pg (ref 26.0–34.0)
MCHC: 33.3 g/dL (ref 30.0–36.0)
MCV: 88.9 fL (ref 78.0–100.0)
Platelets: 221 10*3/uL (ref 150–400)
RBC: 4.76 MIL/uL (ref 4.22–5.81)
RDW: 13.2 % (ref 11.5–15.5)
WBC: 15 10*3/uL — AB (ref 4.0–10.5)

## 2017-01-27 LAB — GLUCOSE, CAPILLARY
GLUCOSE-CAPILLARY: 190 mg/dL — AB (ref 65–99)
GLUCOSE-CAPILLARY: 151 mg/dL — AB (ref 65–99)
GLUCOSE-CAPILLARY: 132 mg/dL — AB (ref 65–99)
Glucose-Capillary: 148 mg/dL — ABNORMAL HIGH (ref 65–99)

## 2017-01-27 SURGERY — REPAIR, HERNIA, VENTRAL, LAPAROSCOPY-ASSISTED
Anesthesia: General

## 2017-01-27 MED ORDER — OXYCODONE HCL 5 MG PO TABS
ORAL_TABLET | ORAL | Status: AC
Start: 2017-01-27 — End: 2017-01-27
  Administered 2017-01-27: 10 mg via ORAL
  Filled 2017-01-27: qty 2

## 2017-01-27 MED ORDER — FENTANYL CITRATE (PF) 250 MCG/5ML IJ SOLN
INTRAMUSCULAR | Status: AC
  Filled 2017-01-27: qty 5

## 2017-01-27 MED ORDER — POTASSIUM CHLORIDE 2 MEQ/ML IV SOLN
INTRAVENOUS | Status: DC
  Administered 2017-01-27 – 2017-01-28 (×2): via INTRAVENOUS
  Filled 2017-01-27 (×5): qty 1000

## 2017-01-27 MED ORDER — PROPOFOL 10 MG/ML IV BOLUS
INTRAVENOUS | Status: AC
  Filled 2017-01-27: qty 20

## 2017-01-27 MED ORDER — 0.9 % SODIUM CHLORIDE (POUR BTL) OPTIME
TOPICAL | Status: DC | PRN
  Administered 2017-01-27: 1000 mL

## 2017-01-27 MED ORDER — AMLODIPINE BESYLATE 10 MG PO TABS
10.0000 mg | ORAL_TABLET | Freq: Every day | ORAL | Status: DC
  Administered 2017-01-27: 10 mg via ORAL
  Filled 2017-01-27 (×2): qty 1

## 2017-01-27 MED ORDER — BUPIVACAINE-EPINEPHRINE 0.25% -1:200000 IJ SOLN
INTRAMUSCULAR | Status: DC | PRN
  Administered 2017-01-27: 13 mL

## 2017-01-27 MED ORDER — ONDANSETRON 4 MG PO TBDP: 4.0000 mg | ORAL_TABLET | Freq: Four times a day (QID) | ORAL | Status: DC | PRN

## 2017-01-27 MED ORDER — LABETALOL HCL 5 MG/ML IV SOLN
INTRAVENOUS | Status: DC | PRN
  Administered 2017-01-27: 5 mg via INTRAVENOUS

## 2017-01-27 MED ORDER — AMLODIPINE BESY-BENAZEPRIL HCL 10-40 MG PO CAPS: 1.0000 | ORAL_CAPSULE | Freq: Every day | ORAL | Status: DC

## 2017-01-27 MED ORDER — GABAPENTIN 300 MG PO CAPS
300.0000 mg | ORAL_CAPSULE | Freq: Two times a day (BID) | ORAL | Status: DC
  Administered 2017-01-27 – 2017-01-28 (×3): 300 mg via ORAL
  Filled 2017-01-27 (×3): qty 1

## 2017-01-27 MED ORDER — DEXTROSE 5 % IV SOLN
1.0000 g | INTRAVENOUS | Status: DC
  Administered 2017-01-27: 1 g via INTRAVENOUS
  Filled 2017-01-27 (×2): qty 10

## 2017-01-27 MED ORDER — SUGAMMADEX SODIUM 500 MG/5ML IV SOLN
INTRAVENOUS | Status: DC | PRN
  Administered 2017-01-27: 500 mg via INTRAVENOUS

## 2017-01-27 MED ORDER — ONDANSETRON HCL 4 MG/2ML IJ SOLN: 4.0000 mg | Freq: Once | INTRAMUSCULAR | Status: DC | PRN

## 2017-01-27 MED ORDER — METFORMIN HCL 500 MG PO TABS
1000.0000 mg | ORAL_TABLET | Freq: Two times a day (BID) | ORAL | Status: DC
  Administered 2017-01-27 – 2017-01-28 (×2): 1000 mg via ORAL
  Filled 2017-01-27 (×2): qty 2

## 2017-01-27 MED ORDER — ONDANSETRON HCL 4 MG/2ML IJ SOLN
INTRAMUSCULAR | Status: DC | PRN
  Administered 2017-01-27: 4 mg via INTRAVENOUS

## 2017-01-27 MED ORDER — CANAGLIFLOZIN 100 MG PO TABS
100.0000 mg | ORAL_TABLET | Freq: Every day | ORAL | Status: DC
  Administered 2017-01-28: 100 mg via ORAL
  Filled 2017-01-27 (×2): qty 1

## 2017-01-27 MED ORDER — OXYCODONE HCL 5 MG PO TABS
5.0000 mg | ORAL_TABLET | ORAL | Status: DC | PRN
  Administered 2017-01-27: 10 mg via ORAL
  Filled 2017-01-27: qty 2

## 2017-01-27 MED ORDER — FENTANYL CITRATE (PF) 100 MCG/2ML IJ SOLN
INTRAMUSCULAR | Status: DC | PRN
  Administered 2017-01-27 (×2): 50 ug via INTRAVENOUS
  Administered 2017-01-27: 100 ug via INTRAVENOUS
  Administered 2017-01-27: 50 ug via INTRAVENOUS

## 2017-01-27 MED ORDER — ONDANSETRON HCL 4 MG/2ML IJ SOLN
4.0000 mg | Freq: Four times a day (QID) | INTRAMUSCULAR | Status: DC | PRN
  Administered 2017-01-27: 4 mg via INTRAVENOUS
  Filled 2017-01-27: qty 2

## 2017-01-27 MED ORDER — INSULIN ASPART 100 UNIT/ML ~~LOC~~ SOLN
0.0000 [IU] | Freq: Three times a day (TID) | SUBCUTANEOUS | Status: DC
  Administered 2017-01-27: 3 [IU] via SUBCUTANEOUS
  Administered 2017-01-28: 4 [IU] via SUBCUTANEOUS

## 2017-01-27 MED ORDER — FENTANYL CITRATE (PF) 100 MCG/2ML IJ SOLN
INTRAMUSCULAR | Status: AC
  Administered 2017-01-27: 50 ug via INTRAVENOUS
  Filled 2017-01-27: qty 2

## 2017-01-27 MED ORDER — LACTATED RINGERS IV SOLN
INTRAVENOUS | Status: DC | PRN
  Administered 2017-01-27 (×2): via INTRAVENOUS

## 2017-01-27 MED ORDER — LACTATED RINGERS IV SOLN: INTRAVENOUS | Status: DC

## 2017-01-27 MED ORDER — DOCUSATE SODIUM 100 MG PO CAPS
100.0000 mg | ORAL_CAPSULE | Freq: Every day | ORAL | Status: DC
Start: 2017-01-27 — End: 2017-01-28
  Administered 2017-01-27: 100 mg via ORAL
  Filled 2017-01-27: qty 1

## 2017-01-27 MED ORDER — FLUTICASONE PROPIONATE 50 MCG/ACT NA SUSP
1.0000 | Freq: Every day | NASAL | Status: DC | PRN
  Filled 2017-01-27: qty 16

## 2017-01-27 MED ORDER — SENNA 8.6 MG PO TABS
1.0000 | ORAL_TABLET | Freq: Two times a day (BID) | ORAL | Status: DC
  Filled 2017-01-27 (×2): qty 1

## 2017-01-27 MED ORDER — BUPROPION HCL ER (XL) 150 MG PO TB24
300.0000 mg | ORAL_TABLET | Freq: Every day | ORAL | Status: DC
  Administered 2017-01-28: 300 mg via ORAL
  Filled 2017-01-27: qty 2

## 2017-01-27 MED ORDER — METHOCARBAMOL 500 MG PO TABS: 500.0000 mg | ORAL_TABLET | Freq: Four times a day (QID) | ORAL | Status: DC | PRN

## 2017-01-27 MED ORDER — FENTANYL CITRATE (PF) 100 MCG/2ML IJ SOLN
25.0000 ug | INTRAMUSCULAR | Status: DC | PRN
  Administered 2017-01-27 (×3): 50 ug via INTRAVENOUS

## 2017-01-27 MED ORDER — VITAMIN D 1000 UNITS PO TABS
2000.0000 [IU] | ORAL_TABLET | Freq: Two times a day (BID) | ORAL | Status: DC
  Administered 2017-01-27 – 2017-01-28 (×2): 2000 [IU] via ORAL
  Filled 2017-01-27 (×3): qty 2

## 2017-01-27 MED ORDER — ACETAMINOPHEN 500 MG PO TABS
1000.0000 mg | ORAL_TABLET | Freq: Every day | ORAL | Status: DC
Start: 2017-01-28 — End: 2017-01-28
  Administered 2017-01-28: 1000 mg via ORAL
  Filled 2017-01-27: qty 2

## 2017-01-27 MED ORDER — LACTATED RINGERS IV SOLN
INTRAVENOUS | Status: DC
  Administered 2017-01-27: 09:00:00 via INTRAVENOUS

## 2017-01-27 MED ORDER — BENAZEPRIL HCL 40 MG PO TABS
40.0000 mg | ORAL_TABLET | Freq: Every day | ORAL | Status: DC
  Administered 2017-01-27: 40 mg via ORAL
  Filled 2017-01-27 (×2): qty 1

## 2017-01-27 MED ORDER — PANTOPRAZOLE SODIUM 40 MG IV SOLR
40.0000 mg | Freq: Every day | INTRAVENOUS | Status: DC
  Administered 2017-01-27: 40 mg via INTRAVENOUS
  Filled 2017-01-27: qty 40

## 2017-01-27 MED ORDER — FERROUS SULFATE 325 (65 FE) MG PO TABS
325.0000 mg | ORAL_TABLET | Freq: Every day | ORAL | Status: DC
  Administered 2017-01-27 – 2017-01-28 (×2): 325 mg via ORAL
  Filled 2017-01-27 (×2): qty 1

## 2017-01-27 MED ORDER — B COMPLEX-C PO TABS
1.0000 | ORAL_TABLET | Freq: Every day | ORAL | Status: DC
  Administered 2017-01-27 – 2017-01-28 (×2): 1 via ORAL
  Filled 2017-01-27 (×2): qty 1

## 2017-01-27 MED ORDER — ATORVASTATIN CALCIUM 10 MG PO TABS
10.0000 mg | ORAL_TABLET | Freq: Every day | ORAL | Status: DC
  Administered 2017-01-27: 10 mg via ORAL
  Filled 2017-01-27: qty 1

## 2017-01-27 MED ORDER — MIDAZOLAM HCL 2 MG/2ML IJ SOLN
INTRAMUSCULAR | Status: AC
  Filled 2017-01-27: qty 2

## 2017-01-27 MED ORDER — SUCCINYLCHOLINE CHLORIDE 20 MG/ML IJ SOLN
INTRAMUSCULAR | Status: DC | PRN
  Administered 2017-01-27: 160 mg via INTRAVENOUS

## 2017-01-27 MED ORDER — ENOXAPARIN SODIUM 40 MG/0.4ML ~~LOC~~ SOLN
40.0000 mg | SUBCUTANEOUS | Status: DC
  Administered 2017-01-28: 40 mg via SUBCUTANEOUS
  Filled 2017-01-27: qty 0.4

## 2017-01-27 MED ORDER — CELECOXIB 200 MG PO CAPS
200.0000 mg | ORAL_CAPSULE | Freq: Two times a day (BID) | ORAL | Status: DC
  Administered 2017-01-27 – 2017-01-28 (×3): 200 mg via ORAL
  Filled 2017-01-27 (×3): qty 1

## 2017-01-27 MED ORDER — CYANOCOBALAMIN 500 MCG PO TABS
2500.0000 ug | ORAL_TABLET | Freq: Every day | ORAL | Status: DC
  Administered 2017-01-27 – 2017-01-28 (×2): 2500 ug via ORAL
  Filled 2017-01-27 (×2): qty 5

## 2017-01-27 MED ORDER — PROPOFOL 10 MG/ML IV BOLUS
INTRAVENOUS | Status: DC | PRN
  Administered 2017-01-27: 200 mg via INTRAVENOUS

## 2017-01-27 MED ORDER — METOCLOPRAMIDE HCL 5 MG/ML IJ SOLN
INTRAMUSCULAR | Status: DC | PRN
  Administered 2017-01-27: 10 mg via INTRAVENOUS

## 2017-01-27 MED ORDER — ROCURONIUM BROMIDE 100 MG/10ML IV SOLN
INTRAVENOUS | Status: DC | PRN
  Administered 2017-01-27: 10 mg via INTRAVENOUS
  Administered 2017-01-27: 40 mg via INTRAVENOUS

## 2017-01-27 MED ORDER — ADULT MULTIVITAMIN W/MINERALS CH
1.0000 | ORAL_TABLET | Freq: Two times a day (BID) | ORAL | Status: DC
  Administered 2017-01-27 (×2): 1 via ORAL
  Filled 2017-01-27 (×2): qty 1

## 2017-01-27 MED ORDER — FENTANYL CITRATE (PF) 100 MCG/2ML IJ SOLN: 25.0000 ug | INTRAMUSCULAR | Status: DC | PRN

## 2017-01-27 MED ORDER — LIDOCAINE HCL (CARDIAC) 20 MG/ML IV SOLN
INTRAVENOUS | Status: DC | PRN
  Administered 2017-01-27: 50 mg via INTRAVENOUS

## 2017-01-27 MED ORDER — CALCIUM CARBONATE 1250 (500 CA) MG PO TABS
ORAL_TABLET | Freq: Two times a day (BID) | ORAL | Status: DC
  Administered 2017-01-27 – 2017-01-28 (×2): 1250 mg via ORAL
  Filled 2017-01-27 (×3): qty 1

## 2017-01-27 MED ORDER — SODIUM CHLORIDE 0.9 % IV SOLN
INTRAVENOUS | Status: DC | PRN
  Administered 2017-01-27: 500 mL

## 2017-01-27 MED ORDER — MIDAZOLAM HCL 5 MG/5ML IJ SOLN
INTRAMUSCULAR | Status: DC | PRN
  Administered 2017-01-27: 2 mg via INTRAVENOUS

## 2017-01-27 MED ORDER — FENTANYL CITRATE (PF) 100 MCG/2ML IJ SOLN
INTRAMUSCULAR | Status: AC
Start: 2017-01-27 — End: 2017-01-27
  Administered 2017-01-27: 50 ug via INTRAVENOUS
  Filled 2017-01-27: qty 2

## 2017-01-27 SURGICAL SUPPLY — 47 items
APPLIER CLIP LOGIC TI 5 (MISCELLANEOUS) IMPLANT
BINDER ABDOMINAL 12 ML 46-62 (SOFTGOODS) ×6 IMPLANT
BLADE CLIPPER SURG (BLADE) IMPLANT
CANISTER SUCT 3000ML PPV (MISCELLANEOUS) IMPLANT
CHLORAPREP W/TINT 26ML (MISCELLANEOUS) ×3 IMPLANT
COVER SURGICAL LIGHT HANDLE (MISCELLANEOUS) ×3 IMPLANT
DECANTER SPIKE VIAL GLASS SM (MISCELLANEOUS) ×3 IMPLANT
DERMABOND ADHESIVE PROPEN (GAUZE/BANDAGES/DRESSINGS) ×2
DERMABOND ADVANCED (GAUZE/BANDAGES/DRESSINGS) ×4
DERMABOND ADVANCED .7 DNX12 (GAUZE/BANDAGES/DRESSINGS) ×2 IMPLANT
DERMABOND ADVANCED .7 DNX6 (GAUZE/BANDAGES/DRESSINGS) ×1 IMPLANT
DEVICE SECURE STRAP 25 ABSORB (INSTRUMENTS) ×6 IMPLANT
DEVICE TROCAR PUNCTURE CLOSURE (ENDOMECHANICALS) ×3 IMPLANT
DRAPE LAPAROSCOPIC ABDOMINAL (DRAPES) ×3 IMPLANT
ELECT REM PT RETURN 9FT ADLT (ELECTROSURGICAL) ×3
ELECTRODE REM PT RTRN 9FT ADLT (ELECTROSURGICAL) ×1 IMPLANT
GAUZE SPONGE 4X4 12PLY STRL (GAUZE/BANDAGES/DRESSINGS) ×3 IMPLANT
GLOVE EUDERMIC 7 POWDERFREE (GLOVE) ×3 IMPLANT
GOWN STRL REUS W/ TWL LRG LVL3 (GOWN DISPOSABLE) ×2 IMPLANT
GOWN STRL REUS W/ TWL XL LVL3 (GOWN DISPOSABLE) ×1 IMPLANT
GOWN STRL REUS W/TWL LRG LVL3 (GOWN DISPOSABLE) ×4
GOWN STRL REUS W/TWL XL LVL3 (GOWN DISPOSABLE) ×2
KIT BASIN OR (CUSTOM PROCEDURE TRAY) ×3 IMPLANT
KIT ROOM TURNOVER OR (KITS) ×3 IMPLANT
MARKER SKIN DUAL TIP RULER LAB (MISCELLANEOUS) ×3 IMPLANT
MESH VENTRALIGHT ST 6X8 (Mesh Specialty) ×2 IMPLANT
MESH VENTRLGHT ELLIPSE 8X6XMFL (Mesh Specialty) ×1 IMPLANT
NEEDLE INSUFFLATION 14GA 120MM (NEEDLE) ×3 IMPLANT
NEEDLE SPNL 22GX3.5 QUINCKE BK (NEEDLE) ×3 IMPLANT
NS IRRIG 1000ML POUR BTL (IV SOLUTION) ×3 IMPLANT
PAD ARMBOARD 7.5X6 YLW CONV (MISCELLANEOUS) ×6 IMPLANT
SCISSORS LAP 5X35 DISP (ENDOMECHANICALS) IMPLANT
SET IRRIG TUBING LAPAROSCOPIC (IRRIGATION / IRRIGATOR) IMPLANT
SHEARS HARMONIC ACE PLUS 36CM (ENDOMECHANICALS) IMPLANT
SLEEVE ENDOPATH XCEL 5M (ENDOMECHANICALS) ×6 IMPLANT
SUT MNCRL AB 4-0 PS2 18 (SUTURE) ×6 IMPLANT
SUT NOVA NAB DX-16 0-1 5-0 T12 (SUTURE) ×6 IMPLANT
TAPE CLOTH SURG 6X10 WHT LF (GAUZE/BANDAGES/DRESSINGS) ×3 IMPLANT
TOWEL OR 17X24 6PK STRL BLUE (TOWEL DISPOSABLE) ×3 IMPLANT
TOWEL OR 17X26 10 PK STRL BLUE (TOWEL DISPOSABLE) ×3 IMPLANT
TRAY FOLEY W/METER SILVER 14FR (SET/KITS/TRAYS/PACK) IMPLANT
TRAY LAPAROSCOPIC MC (CUSTOM PROCEDURE TRAY) ×3 IMPLANT
TROCAR ADV FIXATION 5X100MM (TROCAR) ×6 IMPLANT
TROCAR XCEL NON-BLD 11X100MML (ENDOMECHANICALS) IMPLANT
TROCAR XCEL NON-BLD 5MMX100MML (ENDOMECHANICALS) ×3 IMPLANT
TUBING INSUFFLATION (TUBING) ×3 IMPLANT
WATER STERILE IRR 1000ML POUR (IV SOLUTION) IMPLANT

## 2017-01-27 NOTE — Anesthesia Postprocedure Evaluation (Signed)
Anesthesia Post Note  Patient: Gavin Sweeney  Procedure(s) Performed: LAPAROSCOPIC REPAIR INCARCERATED VENTRAL HERNIA REPAIR WITH MESH (N/A ) INSERTION OF MESH (N/A )     Patient location during evaluation: PACU Anesthesia Type: General Level of consciousness: awake, awake and alert and oriented Pain management: pain level controlled Vital Signs Assessment: post-procedure vital signs reviewed and stable Respiratory status: spontaneous breathing, nonlabored ventilation and respiratory function stable Cardiovascular status: blood pressure returned to baseline Anesthetic complications: no    Last Vitals:  Vitals:   01/27/17 1345 01/27/17 1420  BP: (!) 157/79 (!) 155/81  Pulse: 88 91  Resp: 15 16  Temp:  37.1 C  SpO2: 94% 95%    Last Pain:  Vitals:   01/27/17 1600  TempSrc:   PainSc: Asleep                 Anai Lipson COKER

## 2017-01-27 NOTE — Interval H&P Note (Signed)
History and Physical Interval Note:  01/27/2017 8:16 AM  Gavin Sweeney  has presented today for surgery, with the diagnosis of INCARCERATED VENTRAL HERNIA  The various methods of treatment have been discussed with the patient and family. After consideration of risks, benefits and other options for treatment, the patient has consented to  Procedure(s): LAPAROSCOPIC REPAIR INCARCERATED VENTRAL HERNIA REPAIR WITH MESH (N/A) INSERTION OF MESH (N/A) as a surgical intervention .  The patient's history has been reviewed, patient examined, no change in status, stable for surgery.  I have reviewed the patient's chart and labs.  Questions were answered to the patient's satisfaction.     Ernestene MentionHaywood M Navaeh Kehres

## 2017-01-27 NOTE — Anesthesia Preprocedure Evaluation (Addendum)
Anesthesia Evaluation  Patient identified by MRN, date of birth, ID band Patient awake    Reviewed: Allergy & Precautions, NPO status , Patient's Chart, lab work & pertinent test results  Airway Mallampati: III  TM Distance: >3 FB Neck ROM: Full    Dental  (+) Teeth Intact, Dental Advisory Given   Pulmonary    breath sounds clear to auscultation       Cardiovascular hypertension,  Rhythm:Regular Rate:Normal     Neuro/Psych    GI/Hepatic   Endo/Other  diabetes  Renal/GU      Musculoskeletal   Abdominal (+) + obese,   Peds  Hematology   Anesthesia Other Findings   Reproductive/Obstetrics                            Anesthesia Physical Anesthesia Plan  ASA: III  Anesthesia Plan: General   Post-op Pain Management:    Induction: Intravenous  PONV Risk Score and Plan: 1 and Ondansetron and Metaclopromide  Airway Management Planned: Oral ETT  Additional Equipment:   Intra-op Plan:   Post-operative Plan: Extubation in OR  Informed Consent: I have reviewed the patients History and Physical, chart, labs and discussed the procedure including the risks, benefits and alternatives for the proposed anesthesia with the patient or authorized representative who has indicated his/her understanding and acceptance.   Dental advisory given  Plan Discussed with: CRNA and Anesthesiologist  Anesthesia Plan Comments:         Anesthesia Quick Evaluation

## 2017-01-27 NOTE — Transfer of Care (Signed)
Immediate Anesthesia Transfer of Care Note  Patient: Gavin Sweeney  Procedure(s) Performed: LAPAROSCOPIC REPAIR INCARCERATED VENTRAL HERNIA REPAIR WITH MESH (N/A ) INSERTION OF MESH (N/A )  Patient Location: PACU  Anesthesia Type:General  Level of Consciousness: awake  Airway & Oxygen Therapy: Patient Spontanous Breathing and Patient connected to nasal cannula oxygen  Post-op Assessment: Report given to RN and Post -op Vital signs reviewed and stable  Post vital signs: Reviewed and stable  Last Vitals:  Vitals:   01/27/17 0813  BP: (!) 147/71  Pulse: 73  Resp: 18  Temp: 36.6 C  SpO2: 96%    Last Pain:  Vitals:   01/27/17 0813  TempSrc: Oral         Complications: No apparent anesthesia complications

## 2017-01-27 NOTE — Op Note (Addendum)
Patient Name:           Gavin Sweeney   Date of Surgery:        01/27/2017  Pre op Diagnosis:      Incarcerated ventral hernia  Post op Diagnosis:    Incarcerated ventral hernia  Procedure:                 Laparoscopic repair of incarcerated ventral hernia                                      Lysis of adhesions                                      Implantation of mesh (20 cm x 15 cm Ventralex ST composite mesh)  Surgeon:                     Gavin Sweeney, M.D., FACS  Assistant:                     OR staff  Operative Indications:   . This is a pleasant 61 year old gentleman  who is brought to the operating room electively for laparoscopic repair of incarcerated ventral hernia with mesh.  His PCP is Dr. Kirby FunkJohn Sweeney who referred him.    History of laparoscopic gastric bypass in [place] 2009. He lost 150 pounds but has gained some of that back. He said he has a small hernia at his umbilicus but it was not repaired and is now gotten bigger and he cannot completely reduce it Occasional pain. Not bad. No nausea. He also has a large diastasis recti. He had a son that had to be operated urgently because of incarcerated hernia      CT scan was performed and shows a small periumbilical incisional hernia containing fat only. He has a couple of air bubbles in his bladder but he has no urinary tract symptoms. No history of catheterization. Small 9 millimeter lesion in right hepatic lobe follow-up in 6 months recommended. Gallstones, which are asymptomatic. I discussed the CT scan with him in detail      We talked about his diastases recti and his hernia. We talked about repair now or later after significant weight loss. He says it would probably take him 3 years to lose 100 pounds. He wants to go ahead now. He knows that he is at increased risk of recurrence.      Comorbidities include history gastric bypass 2009. BMI 45. Non-insulin-dependent diabetes. Hypertension. Sleep  apnea. UTIs. Erectile dysfunction. Diastases recti. Does not smoke.        We had a long discussion about the indications, details, techniques, and numerous risk of the surgery. He understands all these issues. My thought is that we will start out laparoscopically. May have to make a small puncture wound over the hernia sac to bring the fascia together with some sutures and then inlay mesh. Always a chance of conversion to open. Always a chance of complications. I advised against cholecystectomy since he is asymptomatic and that would make this a class II wound. He understands that.     he was recently treated for a Klebsiella UTI with a 10 day course of Bactrim    Operative Findings:       There  were 2 adjacent ventral hernias above the umbilicus.  One of these was about 2.5 cm in diameter, the other was less than 1 cm diameter.  They both contained incarcerated omentum.  It took some lysis of adhesions to remove the omentum from the larger hernia defect but it was completely removed.  I looked down the pelvis and the sigmoid colon looked normal.  No evidence of inflammatory disease.  I repaired the hernia with a 20 cm x 15 cm piece of mesh which gave us more than 5 cm overlap in all directions.  Procedure in Detail:          Following the induction of general endotracheal anesthesia a surgical timeout was performed, the abdomen was prepped and draped in a sterile fashion, intravenous antibiotics were given.  0.5% Marcaine with epinephrine was used as local infiltration anesthetic     Pneumoperitoneum was treated with a Veress needle in the left upper quadrant.  This was uneventful.  I inserted a 5 mm optical trocar in this site.  There was no evidence of bowel injury or bleeding.  We looked around and saw some loops of small bowel stuck up in the left upper quadrant higher than we were but there were really no other adhesions.  We can see the hernia area and the incarcerated omentum.   Ultimately I placed a 12 mm trocar in the left abdomen.  5 mm trocar in the left lower quadrant and two 5 mm trochars on the right lateral abdomen.  I was able to debride all the omental adhesions out of the hernia sac and then measured the hernia defect and marked it with a marking pin using a spinal needle.  The mesh was brought to the operative field.  I placed 4 equidistant fixation sutures of #1 Novafil in the mesh.  I marked a template on the abdominal wall with the abdomen deflated.  The mesh was soaked in antibiotic irrigating solution rolled up and inserted.  The mesh was spread out.  I was very careful to make sure that the rough side of the mesh was up toward the abdominal wall and the smooth side was down toward the viscera.     The Novafil fixation sutures were each delivered through the abdominal wall with an Endo Close device using  a small puncture wound at each of the 4 marked suture fixation sites.  I was careful to take a 1 cm bite of fascia in every area.  I then lifted the mesh up and it  deployed nicely with very little redundancy.  All 4 sutures were tied.  I then further secured the mesh to the abdominal wall with the secure strap device.  This was a double crown technique.  I inspected it from the right and the left side 2 or 3 times and there did not appear to be any defect in the fixation.  There was no bleeding.  This completed the hernia repair.  The pneumoperitoneum was released and the trochars were removed.  All of the trocar sites were closed with subcuticular 4-0 Monocryl and Dermabond.  Clean bandages placed and the patient taken to PACU in stable condition.  EBL 20 mL.  Counts correct.  Complications none.    Gavin Sweeney, M.D., FACS General and Minimally Invasive Surgery Breast and Colorectal Surgery   Addendum: I [facility] the [facility] website and reviewed his prescription medication history  01/27/2017 11:20 AM

## 2017-01-27 NOTE — Anesthesia Procedure Notes (Signed)
Procedure Name: Intubation Date/Time: 01/27/2017 10:00 AM Performed by: Purvis Kilts, CRNA Pre-anesthesia Checklist: Emergency Drugs available, Patient identified, Suction available, Patient being monitored and Timeout performed Patient Re-evaluated:Patient Re-evaluated prior to induction Oxygen Delivery Method: Circle system utilized Preoxygenation: Pre-oxygenation with 100% oxygen Induction Type: IV induction Ventilation: Mask ventilation without difficulty Laryngoscope Size: Mac and 4 Grade View: Grade II Tube type: Oral Tube size: 7.5 mm Number of attempts: 1 Airway Equipment and Method: Stylet Placement Confirmation: ETT inserted through vocal cords under direct vision,  positive ETCO2 and breath sounds checked- equal and bilateral Secured at: 22 cm Tube secured with: Tape Dental Injury: Teeth and Oropharynx as per pre-operative assessment

## 2017-01-27 NOTE — Progress Notes (Signed)
Pharmacy - Antibiotic selection for UTI  12/4 UCx: Klebsiella pneumo (R: ampicillin, nitrofurantoin; S-ceftriaxone; see shadow chart for other sensitivites)  Cefazolin was not resistant rather it was just "not reported."   Discontinue Zosyn consult in favor of a more narrow antibiotic. Ceftriaxone 1 g IV q24h. Recommend 7 days. When ready to change to an oral agent would recommend Keflex or Ceftin.   Loura BackJennifer Catawba, PharmD, BCPS Clinical Pharmacist Phone for today [phone] Main pharmacy - [phone] 01/27/2017 3:54 PM

## 2017-01-28 ENCOUNTER — Encounter (HOSPITAL_COMMUNITY): Payer: Self-pay | Admitting: General Surgery

## 2017-01-28 DIAGNOSIS — K436 Other and unspecified ventral hernia with obstruction, without gangrene: Secondary | ICD-10-CM | POA: Diagnosis not present

## 2017-01-28 HISTORY — DX: Morbid (severe) obesity due to excess calories: E66.01

## 2017-01-28 LAB — GLUCOSE, CAPILLARY: GLUCOSE-CAPILLARY: 157 mg/dL — AB (ref 65–99)

## 2017-01-28 MED ORDER — HYDROCODONE-ACETAMINOPHEN 5-325 MG PO TABS: 1.0000 | ORAL_TABLET | Freq: Four times a day (QID) | ORAL | 0 refills | Status: AC | PRN

## 2017-01-28 NOTE — Progress Notes (Signed)
Pt was ambulating with steady gait. Pain is controlled, abd dressing dry and intact. Discharge instructions given to pt. Discharged to home accompanied by wife.

## 2017-01-28 NOTE — Plan of Care (Signed)
  Education: Knowledge of General Education information will improve 01/28/2017 0425 - Progressing by Olena Materobinson, Emelyn Roen G, RN Note POC reviewed with pt. and wife.

## 2017-01-28 NOTE — Discharge Summary (Addendum)
Patient ID: Gavin Sweeney [id number] 61 y.o. 09-Apr-1955  Admit date: 01/27/2017  Discharge date and time: 01/28/2017  Admitting Physician: Ernestene MentionHaywood M Dontrel Smethers  Discharge Physician: Ernestene MentionHaywood M Klohe Lovering  Admission Diagnoses: INCARCERATED VENTRAL HERNIA  Discharge Diagnoses:   Incarcerated ventral hernia                                          Diastases recti                                          Type 2 diabetes treated without insulin                                          BMI 45                                          History Roux-en-Y gastric bypass                                          Sleep apnea                                          Hypertension, essential                                          recurrent urinary tract infections  Operations: Procedure(s): LAPAROSCOPIC REPAIR INCARCERATED VENTRAL HERNIA REPAIR WITH MESH INSERTION OF MESH  Admission Condition: good  Discharged Condition: good  Indication for Admission: This is a pleasant 61 year old gentleman  who is brought to the operating room electively for laparoscopic repair of incarcerated ventral hernia with mesh.  His PCP is Dr. Kirby FunkJohn Griffin. History of laparoscopic gastric bypass in [place] 2009. He lost 150 pounds but has gained some of that back. He said he has a small hernia at his umbilicus but it was not repaired and is now gotten bigger and he cannot completely reduce it Occasional pain. Not bad. No nausea. He also has a large diastasis recti. He had a son that had to be operated urgently because of incarcerated hernia CT scan was performed and shows a small periumbilical incisional hernia containing fat only. He has a couple of air bubbles in his bladder but he has no urinary tract symptoms. No history of catheterization. Small 9 millimeter lesion in right hepatic lobe follow-up in 6 months recommended. Gallstones, which are asymptomatic. I discussed the CT scan with him in  detail We talked about his diastases recti and his hernia. We talked about repair now or later after significant weight loss. He says it would probably take him 3 years to lose 100 pounds. He wants to go ahead now. He knows that he is at increased risk of recurrence. Comorbidities include history gastric bypass 2009.  BMI 45. Non-insulin-dependent diabetes. Hypertension. Sleep apnea. UTIs. Erectile dysfunction. Diastases recti. Does not smoke.  We had a long discussion about the indications, details, techniques, and numerous risk of the surgery. He understands all these issues. My thought is that we will start out laparoscopically. May have to make a small puncture wound over the hernia sac to bring the fascia together with some sutures and then inlay mesh. Always a chance of conversion to open. Always a chance of complications. I advised against cholecystectomy since he is asymptomatic and that would make this a class II wound. He understands that.  He was recently treated for a Klebsiella UTI with a 10 day course of Bactrim    Hospital Course: On the day of admission the patient was taken to the operating room and underwent laparoscopic repair of incarcerated ventral hernia with mesh.  He had omentum incarcerated into his periumbilical ventral hernia which was debridement.  A 20 cm x 15 cm piece of ventral X ST composite mesh was implanted.  The surgery went well.  He was observed overnight and did very well.  Pain was well controlled.  He ambulated frequently.  He voided without difficulty.  He had no urinary complaints.  He was tolerating regular diet.  Had no breathing problems.  Blood sugars were generally below 150.  Examination on the morning after surgery revealed his lungs were clear.  Abdomen was obese but soft, nondistended, nontender.  All of the wounds looked good.  He and his wife wanted to go home and I felt this was appropriate.     He was told to  continue his usual medications      The only additional prescription I gave him was for Norco for pain but encouraged transition to high-dose Tylenol as soon as possible diet and activities were discussed.  I asked him to return to see me in the office in 2-3 weeks.  I also asked him to see Dr. Kirby Funk in January to discuss general medical problems and especially his recurrent urinary tract infections.  Consults: None  Significant Diagnostic Studies: Lab work  Treatments: surgery: Laparoscopic repair of ventral hernia  Disposition: Home  Patient Instructions:  Allergies as of 01/28/2017      Reactions   Tessalon [benzonatate] Other (See Comments)   Tessalon pearls    "sees things"      Medication List    TAKE these medications   acetaminophen 500 MG tablet Commonly known as:  TYLENOL Take 1,000 mg by mouth daily.   amLODipine-benazepril 10-40 MG capsule Commonly known as:  LOTREL Take 1 capsule by mouth daily.   atorvastatin 10 MG tablet Commonly known as:  LIPITOR Take 10 mg by mouth at bedtime.   Biotin 5 MG Caps Take 5 mg by mouth 2 (two) times a week.   buPROPion 300 MG 24 hr tablet Commonly known as:  WELLBUTRIN XL Take 300 mg by mouth daily.   CALCIUM PO Take 1 tablet by mouth 2 (two) times daily.   docusate sodium 100 MG capsule Commonly known as:  COLACE Take 100 mg by mouth daily.   EQL VITAMIN D3 2000 units Caps Generic drug:  Cholecalciferol Take 2,000 Units by mouth 2 (two) times daily.   ferrous sulfate 325 (65 FE) MG tablet Take 325 mg by mouth daily with breakfast.   fluticasone 50 MCG/ACT nasal spray Commonly known as:  FLONASE Place 1 spray into both nostrils daily as needed for allergies.   HYDROcodone-acetaminophen 5-325  MG tablet Commonly known as:  NORCO Take 1-2 tablets by mouth every 6 (six) hours as needed for moderate pain or severe pain.   JARDIANCE 10 MG Tabs tablet Generic drug:  empagliflozin Take 10 mg by mouth  daily.   metFORMIN 500 MG tablet Commonly known as:  GLUCOPHAGE Take 1,000 mg by mouth 2 (two) times daily.   MULTIVITAMIN PO Take 1 tablet by mouth 2 (two) times daily.   SUPER B COMPLEX/C PO Take 1 tablet by mouth daily.   Vitamin B-12 2500 MCG Subl Place 2,500 mcg under the tongue daily.            Discharge Care Instructions  (From admission, onward)        Start     Ordered   01/28/17 0000  Discharge wound care:    Comments:  You may shower, but no tub baths  All of the incisions are painted with clear plastic superglue There are no sutures or staples that will need to be removed Avoid sweaty or dirty activities Cover the wounds with dry gauze for a few days.  Eventually you will not need any dressing at all   01/28/17 0621      Activity: Lots of ambulation but no sports or heavy lifting for 6 weeks Diet: diabetic diet Wound Care: none needed  Follow-up:  With Dr. Derrell LollingIngram in 3 weeks.  Signed: Angelia MouldHaywood M. Derrell LollingIngram, M.D., FACS General and minimally invasive surgery Breast and Colorectal Surgery   Addendum: [facility] and reviewed his prescription medication history  01/28/2017, 6:24 AM

## 2017-01-28 NOTE — Discharge Instructions (Signed)
[facility] ______[facility] °LAPAROSCOPIC SURGERY: POST OP INSTRUCTIONS °Always review your discharge instruction sheet given to you by the facility where your surgery was performed. °IF YOU HAVE DISABILITY OR FAMILY LEAVE FORMS, YOU MUST BRING THEM TO THE OFFICE FOR PROCESSING.   °DO NOT GIVE THEM TO YOUR DOCTOR. ° °1. A prescription for pain medication may be given to you upon discharge.  Take your pain medication as prescribed, if needed.  If narcotic pain medicine is not needed, then you may take acetaminophen (Tylenol) or ibuprofen (Advil) as needed. °2. Take your usually prescribed medications unless otherwise directed. °3. If you need a refill on your pain medication, please contact your pharmacy.  They will contact our office to request authorization. Prescriptions will not be filled after 5pm or on week-ends. °4. You should follow a light diet the first few days after arrival home, such as soup and crackers, etc.  Be sure to include lots of fluids daily. °5. Most patients will experience some swelling and bruising in the area of the incisions.  Ice packs will help.  Swelling and bruising can take several days to resolve.  °6. It is common to experience some constipation if taking pain medication after surgery.  Increasing fluid intake and taking a stool softener (such as Colace) will usually help or prevent this problem from occurring.  A mild laxative (Milk of Magnesia or Miralax) should be taken according to package instructions if there are no bowel movements after 48 hours. °7. Unless discharge instructions indicate otherwise, you may remove your bandages 24-48 hours after surgery, and you may shower at that time.  You may have steri-strips (small skin tapes) in place directly over the incision.  These strips should be left on the skin for 7-10 days.  If your surgeon used skin glue on the incision, you may shower in 24 hours.  The glue will flake off over the next 2-3 weeks.  Any sutures or  staples will be removed at the office during your follow-up visit. °8. ACTIVITIES:  You may resume regular (light) daily activities beginning the next day--such as daily self-care, walking, climbing stairs--gradually increasing activities as tolerated.  You may have sexual intercourse when it is comfortable.  Refrain from any heavy lifting or straining until approved by your doctor. °a. You may drive when you are no longer taking prescription pain medication, you can comfortably wear a seatbelt, and you can safely maneuver your car and apply brakes. °b. RETURN TO WORK:  __________________________________________________________ °9. You should see your doctor in the office for a follow-up appointment approximately 2-3 weeks after your surgery.  Make sure that you call for this appointment within a day or two after you arrive home to insure a convenient appointment time. °10. OTHER INSTRUCTIONS: __________________________________________________________________________________________________________________________ __________________________________________________________________________________________________________________________ °WHEN TO CALL YOUR DOCTOR: °1. Fever over 101.0 °2. Inability to urinate °3. Continued bleeding from incision. °4. Increased pain, redness, or drainage from the incision. °5. Increasing abdominal pain ° °The clinic staff is available to answer your questions during regular business hours.  Please don’t hesitate to call and ask to speak to one of the nurses for clinical concerns.  If you have a medical emergency, go to the nearest emergency room or call 911.  A surgeon from [facility] is always on call at the hospital. °[address], [address], [place], [place]  [address] ? [address] [address], [place], [place]   [address] °[phone] ? [phone] ? FAX [phone] °Web site:   [email or web address] °

## 2017-03-03 DIAGNOSIS — E119 Type 2 diabetes mellitus without complications: Secondary | ICD-10-CM | POA: Diagnosis not present

## 2017-03-03 DIAGNOSIS — I10 Essential (primary) hypertension: Secondary | ICD-10-CM | POA: Diagnosis not present

## 2017-04-06 DIAGNOSIS — E119 Type 2 diabetes mellitus without complications: Secondary | ICD-10-CM | POA: Diagnosis not present

## 2017-04-06 DIAGNOSIS — B399 Histoplasmosis, unspecified: Secondary | ICD-10-CM | POA: Diagnosis not present

## 2017-06-24 DIAGNOSIS — E7800 Pure hypercholesterolemia, unspecified: Secondary | ICD-10-CM | POA: Diagnosis not present

## 2017-06-24 DIAGNOSIS — E1169 Type 2 diabetes mellitus with other specified complication: Secondary | ICD-10-CM | POA: Diagnosis not present

## 2017-06-24 DIAGNOSIS — G4733 Obstructive sleep apnea (adult) (pediatric): Secondary | ICD-10-CM | POA: Diagnosis not present

## 2017-06-24 DIAGNOSIS — Z0000 Encounter for general adult medical examination without abnormal findings: Secondary | ICD-10-CM | POA: Diagnosis not present

## 2017-06-24 DIAGNOSIS — I10 Essential (primary) hypertension: Secondary | ICD-10-CM | POA: Diagnosis not present

## 2017-06-24 DIAGNOSIS — Z9884 Bariatric surgery status: Secondary | ICD-10-CM | POA: Diagnosis not present

## 2017-08-08 DIAGNOSIS — Z23 Encounter for immunization: Secondary | ICD-10-CM | POA: Diagnosis not present

## 2017-11-18 DIAGNOSIS — E113293 Type 2 diabetes mellitus with mild nonproliferative diabetic retinopathy without macular edema, bilateral: Secondary | ICD-10-CM | POA: Diagnosis not present

## 2017-11-18 DIAGNOSIS — I10 Essential (primary) hypertension: Secondary | ICD-10-CM | POA: Diagnosis not present

## 2017-11-18 DIAGNOSIS — R490 Dysphonia: Secondary | ICD-10-CM | POA: Diagnosis not present

## 2017-12-10 DIAGNOSIS — Z23 Encounter for immunization: Secondary | ICD-10-CM | POA: Diagnosis not present

## 2018-03-22 DIAGNOSIS — M25511 Pain in right shoulder: Secondary | ICD-10-CM | POA: Diagnosis not present

## 2018-03-22 DIAGNOSIS — I10 Essential (primary) hypertension: Secondary | ICD-10-CM | POA: Diagnosis not present

## 2018-03-22 DIAGNOSIS — E1169 Type 2 diabetes mellitus with other specified complication: Secondary | ICD-10-CM | POA: Diagnosis not present

## 2018-05-11 DIAGNOSIS — L03116 Cellulitis of left lower limb: Secondary | ICD-10-CM | POA: Diagnosis not present

## 2018-05-11 DIAGNOSIS — I872 Venous insufficiency (chronic) (peripheral): Secondary | ICD-10-CM | POA: Diagnosis not present

## 2018-06-14 DIAGNOSIS — M25511 Pain in right shoulder: Secondary | ICD-10-CM | POA: Diagnosis not present

## 2020-03-09 DIAGNOSIS — R5383 Other fatigue: Secondary | ICD-10-CM | POA: Diagnosis not present

## 2020-03-09 DIAGNOSIS — E1169 Type 2 diabetes mellitus with other specified complication: Secondary | ICD-10-CM | POA: Diagnosis not present

## 2020-03-09 DIAGNOSIS — K921 Melena: Secondary | ICD-10-CM | POA: Diagnosis not present

## 2020-03-09 DIAGNOSIS — I10 Essential (primary) hypertension: Secondary | ICD-10-CM | POA: Diagnosis not present

## 2020-03-09 DIAGNOSIS — M545 Low back pain, unspecified: Secondary | ICD-10-CM | POA: Diagnosis not present

## 2020-03-12 DIAGNOSIS — K921 Melena: Secondary | ICD-10-CM | POA: Diagnosis not present

## 2020-04-17 DIAGNOSIS — M5459 Other low back pain: Secondary | ICD-10-CM | POA: Diagnosis not present

## 2020-04-17 DIAGNOSIS — M65331 Trigger finger, right middle finger: Secondary | ICD-10-CM | POA: Diagnosis not present

## 2020-04-17 DIAGNOSIS — M65341 Trigger finger, right ring finger: Secondary | ICD-10-CM | POA: Diagnosis not present

## 2020-04-17 DIAGNOSIS — M65332 Trigger finger, left middle finger: Secondary | ICD-10-CM | POA: Diagnosis not present

## 2020-07-06 DIAGNOSIS — Z8719 Personal history of other diseases of the digestive system: Secondary | ICD-10-CM | POA: Diagnosis not present

## 2020-07-06 DIAGNOSIS — D649 Anemia, unspecified: Secondary | ICD-10-CM | POA: Diagnosis not present

## 2020-07-06 DIAGNOSIS — E1165 Type 2 diabetes mellitus with hyperglycemia: Secondary | ICD-10-CM | POA: Diagnosis not present

## 2020-07-06 DIAGNOSIS — I10 Essential (primary) hypertension: Secondary | ICD-10-CM | POA: Diagnosis not present

## 2020-08-08 DIAGNOSIS — H5203 Hypermetropia, bilateral: Secondary | ICD-10-CM | POA: Diagnosis not present

## 2020-08-08 DIAGNOSIS — E119 Type 2 diabetes mellitus without complications: Secondary | ICD-10-CM | POA: Diagnosis not present

## 2020-08-08 DIAGNOSIS — H52203 Unspecified astigmatism, bilateral: Secondary | ICD-10-CM | POA: Diagnosis not present

## 2020-08-08 DIAGNOSIS — H31003 Unspecified chorioretinal scars, bilateral: Secondary | ICD-10-CM | POA: Diagnosis not present

## 2020-10-09 DIAGNOSIS — M65332 Trigger finger, left middle finger: Secondary | ICD-10-CM | POA: Diagnosis not present

## 2020-10-09 DIAGNOSIS — M65331 Trigger finger, right middle finger: Secondary | ICD-10-CM | POA: Diagnosis not present

## 2020-10-09 DIAGNOSIS — M65341 Trigger finger, right ring finger: Secondary | ICD-10-CM | POA: Diagnosis not present

## 2020-11-01 DIAGNOSIS — M65341 Trigger finger, right ring finger: Secondary | ICD-10-CM | POA: Diagnosis not present

## 2020-11-01 DIAGNOSIS — M65331 Trigger finger, right middle finger: Secondary | ICD-10-CM | POA: Diagnosis not present

## 2020-12-04 DIAGNOSIS — M65341 Trigger finger, right ring finger: Secondary | ICD-10-CM | POA: Diagnosis not present

## 2020-12-04 DIAGNOSIS — M65331 Trigger finger, right middle finger: Secondary | ICD-10-CM | POA: Diagnosis not present

## 2020-12-04 DIAGNOSIS — M65332 Trigger finger, left middle finger: Secondary | ICD-10-CM | POA: Diagnosis not present

## 2020-12-24 DIAGNOSIS — Z9884 Bariatric surgery status: Secondary | ICD-10-CM | POA: Diagnosis not present

## 2020-12-24 DIAGNOSIS — E7800 Pure hypercholesterolemia, unspecified: Secondary | ICD-10-CM | POA: Diagnosis not present

## 2020-12-24 DIAGNOSIS — E1169 Type 2 diabetes mellitus with other specified complication: Secondary | ICD-10-CM | POA: Diagnosis not present

## 2020-12-24 DIAGNOSIS — Z125 Encounter for screening for malignant neoplasm of prostate: Secondary | ICD-10-CM | POA: Diagnosis not present

## 2021-01-08 DIAGNOSIS — M65332 Trigger finger, left middle finger: Secondary | ICD-10-CM | POA: Diagnosis not present

## 2021-01-08 DIAGNOSIS — M65342 Trigger finger, left ring finger: Secondary | ICD-10-CM | POA: Diagnosis not present

## 2021-01-30 DIAGNOSIS — G4733 Obstructive sleep apnea (adult) (pediatric): Secondary | ICD-10-CM | POA: Diagnosis not present

## 2021-03-02 DIAGNOSIS — G4733 Obstructive sleep apnea (adult) (pediatric): Secondary | ICD-10-CM | POA: Diagnosis not present

## 2021-04-02 DIAGNOSIS — G4733 Obstructive sleep apnea (adult) (pediatric): Secondary | ICD-10-CM | POA: Diagnosis not present

## 2021-08-01 DIAGNOSIS — I10 Essential (primary) hypertension: Secondary | ICD-10-CM | POA: Diagnosis not present

## 2021-08-01 DIAGNOSIS — F325 Major depressive disorder, single episode, in full remission: Secondary | ICD-10-CM | POA: Diagnosis not present

## 2021-08-01 DIAGNOSIS — I872 Venous insufficiency (chronic) (peripheral): Secondary | ICD-10-CM | POA: Diagnosis not present

## 2021-08-01 DIAGNOSIS — E1169 Type 2 diabetes mellitus with other specified complication: Secondary | ICD-10-CM | POA: Diagnosis not present

## 2021-08-14 DIAGNOSIS — H40033 Anatomical narrow angle, bilateral: Secondary | ICD-10-CM | POA: Diagnosis not present

## 2021-08-14 DIAGNOSIS — E119 Type 2 diabetes mellitus without complications: Secondary | ICD-10-CM | POA: Diagnosis not present

## 2021-08-14 DIAGNOSIS — H2513 Age-related nuclear cataract, bilateral: Secondary | ICD-10-CM | POA: Diagnosis not present

## 2021-08-14 DIAGNOSIS — H524 Presbyopia: Secondary | ICD-10-CM | POA: Diagnosis not present

## 2021-09-12 DIAGNOSIS — Z6841 Body Mass Index (BMI) 40.0 and over, adult: Secondary | ICD-10-CM | POA: Diagnosis not present

## 2021-09-12 DIAGNOSIS — I10 Essential (primary) hypertension: Secondary | ICD-10-CM | POA: Diagnosis not present

## 2021-09-12 DIAGNOSIS — Z713 Dietary counseling and surveillance: Secondary | ICD-10-CM | POA: Diagnosis not present

## 2022-01-15 DIAGNOSIS — G4733 Obstructive sleep apnea (adult) (pediatric): Secondary | ICD-10-CM | POA: Diagnosis not present

## 2022-01-15 DIAGNOSIS — I10 Essential (primary) hypertension: Secondary | ICD-10-CM | POA: Diagnosis not present

## 2022-01-15 DIAGNOSIS — E1169 Type 2 diabetes mellitus with other specified complication: Secondary | ICD-10-CM | POA: Diagnosis not present

## 2022-01-15 DIAGNOSIS — I872 Venous insufficiency (chronic) (peripheral): Secondary | ICD-10-CM | POA: Diagnosis not present

## 2022-01-15 DIAGNOSIS — E7800 Pure hypercholesterolemia, unspecified: Secondary | ICD-10-CM | POA: Diagnosis not present

## 2022-01-15 DIAGNOSIS — Z125 Encounter for screening for malignant neoplasm of prostate: Secondary | ICD-10-CM | POA: Diagnosis not present

## 2022-01-15 DIAGNOSIS — F325 Major depressive disorder, single episode, in full remission: Secondary | ICD-10-CM | POA: Diagnosis not present

## 2022-01-15 DIAGNOSIS — Z9884 Bariatric surgery status: Secondary | ICD-10-CM | POA: Diagnosis not present

## 2022-02-11 DIAGNOSIS — Z1212 Encounter for screening for malignant neoplasm of rectum: Secondary | ICD-10-CM | POA: Diagnosis not present

## 2022-02-11 DIAGNOSIS — Z1211 Encounter for screening for malignant neoplasm of colon: Secondary | ICD-10-CM | POA: Diagnosis not present

## 2022-05-15 DIAGNOSIS — G4733 Obstructive sleep apnea (adult) (pediatric): Secondary | ICD-10-CM | POA: Diagnosis not present

## 2022-06-14 DIAGNOSIS — G4733 Obstructive sleep apnea (adult) (pediatric): Secondary | ICD-10-CM | POA: Diagnosis not present

## 2022-06-20 ENCOUNTER — Ambulatory Visit
Admission: RE | Admit: 2022-06-20 | Discharge: 2022-06-20 | Disposition: A | Discharge status: Home / Self Care | Payer: BC Managed Care – PPO | Source: Ambulatory Visit | Attending: Internal Medicine | Admitting: Internal Medicine

## 2022-06-20 ENCOUNTER — Other Ambulatory Visit: Payer: Self-pay | Admitting: Internal Medicine

## 2022-06-20 DIAGNOSIS — M545 Low back pain, unspecified: Secondary | ICD-10-CM

## 2022-06-20 DIAGNOSIS — M79605 Pain in left leg: Secondary | ICD-10-CM | POA: Diagnosis not present

## 2022-07-15 DIAGNOSIS — G4733 Obstructive sleep apnea (adult) (pediatric): Secondary | ICD-10-CM | POA: Diagnosis not present

## 2022-07-21 DIAGNOSIS — M5451 Vertebrogenic low back pain: Secondary | ICD-10-CM | POA: Diagnosis not present

## 2022-07-22 DIAGNOSIS — M7989 Other specified soft tissue disorders: Secondary | ICD-10-CM | POA: Diagnosis not present

## 2022-07-22 DIAGNOSIS — E611 Iron deficiency: Secondary | ICD-10-CM | POA: Diagnosis not present

## 2022-07-22 DIAGNOSIS — I872 Venous insufficiency (chronic) (peripheral): Secondary | ICD-10-CM | POA: Diagnosis not present

## 2022-07-22 DIAGNOSIS — I10 Essential (primary) hypertension: Secondary | ICD-10-CM | POA: Diagnosis not present

## 2022-07-22 DIAGNOSIS — E1169 Type 2 diabetes mellitus with other specified complication: Secondary | ICD-10-CM | POA: Diagnosis not present

## 2022-12-24 ENCOUNTER — Ambulatory Visit: Payer: Self-pay | Admitting: Podiatry

## 2023-01-21 ENCOUNTER — Encounter: Payer: Self-pay | Admitting: Podiatry

## 2023-01-21 ENCOUNTER — Ambulatory Visit: Payer: Medicare Other | Admitting: Podiatry

## 2023-01-21 DIAGNOSIS — M79674 Pain in right toe(s): Secondary | ICD-10-CM

## 2023-01-21 DIAGNOSIS — B351 Tinea unguium: Secondary | ICD-10-CM

## 2023-01-21 DIAGNOSIS — M79675 Pain in left toe(s): Secondary | ICD-10-CM | POA: Diagnosis not present

## 2023-01-21 NOTE — Progress Notes (Signed)
   Chief Complaint  Patient presents with   [facility]    RM#8 [facility] patient states has no issues at this time.    SUBJECTIVE Patient with a history of diabetes mellitus presents to office today complaining of elongated, thickened nails that cause pain while ambulating in shoes.  Patient is unable to trim their own nails. Patient is here for further evaluation and treatment.  Past Medical History:  Diagnosis Date   Depression    Diabetes mellitus without complication (HCC)    History of hiatal hernia    Hyperlipemia    Hypertension    Incarcerated ventral hernia 01/27/2017   Morbid obesity (HCC) 01/28/2017   Sleep apnea    cpap    Allergies  Allergen Reactions   Tessalon [Benzonatate] Other (See Comments)    Tessalon pearls    "sees things"     OBJECTIVE General Patient is awake, alert, and oriented x 3 and in no acute distress. Derm Skin is dry and supple bilateral. Negative open lesions or macerations. Remaining integument unremarkable. Nails are tender, long, thickened and dystrophic with subungual debris, consistent with onychomycosis, 1-5 bilateral. No signs of infection noted. Vasc  DP and PT pedal pulses palpable bilaterally. Temperature gradient within normal limits.  Neuro Epicritic and protective threshold sensation diminished bilaterally.  Musculoskeletal Exam No symptomatic pedal deformities noted bilateral. Muscular strength within normal limits.  ASSESSMENT 1. Diabetes Mellitus w/ peripheral neuropathy 2.  Pain due to onychomycosis of toenails bilateral  PLAN OF CARE 1. Patient evaluated today. 2. Instructed to maintain good pedal hygiene and foot care. Stressed importance of controlling blood sugar.  3. Mechanical debridement of nails 1-5 bilaterally performed using a nail nipper. Filed with dremel without incident.  4. Return to clinic in 3 mos.     Felecia Shelling, DPM [facility]  Dr. Felecia Shelling, DPM    [address] [place], [place] [address]                Office [phone]  Fax [phone]

## 2023-01-23 DIAGNOSIS — E1169 Type 2 diabetes mellitus with other specified complication: Secondary | ICD-10-CM | POA: Diagnosis not present

## 2023-01-23 DIAGNOSIS — Z0000 Encounter for general adult medical examination without abnormal findings: Secondary | ICD-10-CM | POA: Diagnosis not present

## 2023-01-23 DIAGNOSIS — K909 Intestinal malabsorption, unspecified: Secondary | ICD-10-CM | POA: Diagnosis not present

## 2023-01-23 DIAGNOSIS — M7989 Other specified soft tissue disorders: Secondary | ICD-10-CM | POA: Diagnosis not present

## 2023-01-23 DIAGNOSIS — E119 Type 2 diabetes mellitus without complications: Secondary | ICD-10-CM | POA: Diagnosis not present

## 2023-01-23 DIAGNOSIS — Z79899 Other long term (current) drug therapy: Secondary | ICD-10-CM | POA: Diagnosis not present

## 2023-01-23 DIAGNOSIS — E7800 Pure hypercholesterolemia, unspecified: Secondary | ICD-10-CM | POA: Diagnosis not present

## 2023-01-23 DIAGNOSIS — I10 Essential (primary) hypertension: Secondary | ICD-10-CM | POA: Diagnosis not present

## 2023-01-23 DIAGNOSIS — G4733 Obstructive sleep apnea (adult) (pediatric): Secondary | ICD-10-CM | POA: Diagnosis not present

## 2023-01-23 DIAGNOSIS — I872 Venous insufficiency (chronic) (peripheral): Secondary | ICD-10-CM | POA: Diagnosis not present

## 2023-01-23 DIAGNOSIS — G5691 Unspecified mononeuropathy of right upper limb: Secondary | ICD-10-CM | POA: Diagnosis not present

## 2023-02-13 DIAGNOSIS — I10 Essential (primary) hypertension: Secondary | ICD-10-CM | POA: Diagnosis not present

## 2023-04-23 ENCOUNTER — Encounter: Payer: Self-pay | Admitting: Podiatry

## 2023-04-23 ENCOUNTER — Ambulatory Visit: Payer: BC Managed Care – PPO | Admitting: Podiatry

## 2023-04-23 DIAGNOSIS — B351 Tinea unguium: Secondary | ICD-10-CM | POA: Diagnosis not present

## 2023-04-23 DIAGNOSIS — M79675 Pain in left toe(s): Secondary | ICD-10-CM

## 2023-04-23 DIAGNOSIS — M79674 Pain in right toe(s): Secondary | ICD-10-CM | POA: Diagnosis not present

## 2023-04-23 DIAGNOSIS — B353 Tinea pedis: Secondary | ICD-10-CM

## 2023-04-23 DIAGNOSIS — E1142 Type 2 diabetes mellitus with diabetic polyneuropathy: Secondary | ICD-10-CM

## 2023-04-23 MED ORDER — KETOCONAZOLE 2 % EX CREA: 1.0000 | TOPICAL_CREAM | Freq: Every day | CUTANEOUS | 0 refills | Status: AC

## 2023-04-23 NOTE — Progress Notes (Signed)
  Subjective:  Patient ID: Gavin Sweeney, male    DOB: 08-13-1955,  MRN: [id number]  Chief Complaint  Patient presents with   Diabetes    Diabetic foot care. Pt has no other complaints or concerns    68 y.o. male presents with the above complaint. History confirmed with patient. Patient presenting with pain related to dystrophic thickened elongated nails. Patient is unable to trim own nails related to nail dystrophy and/or mobility issues. Patient does have a history of T2DM.  Objective:  Physical Exam: warm, good capillary refill, pedal skin atrophic nail exam onychomycosis of the toenails, onycholysis, and dystrophic nails DP pulses palpable, PT pulses palpable, protective sensation absent, and vibratory sensation diminished Left Foot:  Pain with palpation of nails due to elongation and dystrophic growth.  Right Foot: Pain with palpation of nails due to elongation and dystrophic growth.  Dry, flaky, xerotic pedal skin in moccasin distribution  Assessment:   1. Pain due to onychomycosis of toenails of both feet   2. Diabetic polyneuropathy associated with type 2 diabetes mellitus (HCC)   3. Tinea pedis of both feet      Plan:  Patient was evaluated and treated and all questions answered.  # Tinea pedis -2% ketoconazole cream sent to patient's pharmacy to be applied once a day for 3 weeks -Discussed importance of pedal hygiene and home care instructions for preventing recurrence of athletes foot.  #Onychomycosis with pain  -Nails palliatively debrided as below. -Educated on self-care  Procedure: Nail Debridement Rationale: Pain Type of Debridement: manual, sharp debridement. Instrumentation: Nail nipper, rotary burr. Number of Nails: 10  Return in about 3 months (around 07/24/2023) for Diabetic Foot Care.         Bronwen Betters, DPM [facility] / [facility]

## 2023-07-23 ENCOUNTER — Ambulatory Visit (INDEPENDENT_AMBULATORY_CARE_PROVIDER_SITE_OTHER): Admitting: Podiatry

## 2023-07-23 ENCOUNTER — Encounter: Payer: Self-pay | Admitting: Podiatry

## 2023-07-23 VITALS — Ht 72.0 in | Wt 335.0 lb

## 2023-07-23 DIAGNOSIS — E1142 Type 2 diabetes mellitus with diabetic polyneuropathy: Secondary | ICD-10-CM

## 2023-07-23 DIAGNOSIS — M79675 Pain in left toe(s): Secondary | ICD-10-CM

## 2023-07-23 DIAGNOSIS — M79674 Pain in right toe(s): Secondary | ICD-10-CM

## 2023-07-23 DIAGNOSIS — B353 Tinea pedis: Secondary | ICD-10-CM

## 2023-07-23 DIAGNOSIS — B351 Tinea unguium: Secondary | ICD-10-CM | POA: Diagnosis not present

## 2023-07-23 NOTE — Progress Notes (Signed)
  Subjective:  Patient ID: Gavin Sweeney, male    DOB: May 09, 1955,  MRN: [id number]  Chief Complaint  Patient presents with   Nail Problem    Pt is here for [facility] last A1C was 6.4    68 y.o. male presents with the above complaint. History confirmed with patient. Patient presenting with pain related to dystrophic thickened elongated nails. Patient is unable to trim own nails related to nail dystrophy and/or mobility issues. Patient does have a history of T2DM. Does report minimal improvement to skin changes, does report inadequate use of the ketoconazole  cream.  Objective:  Physical Exam: warm, good capillary refill, pedal skin atrophic nail exam onychomycosis of the toenails, onycholysis, and dystrophic nails DP pulses palpable, PT pulses palpable, protective sensation absent, and vibratory sensation diminished Left Foot:  Pain with palpation of nails due to elongation and dystrophic growth.  Right Foot: Pain with palpation of nails due to elongation and dystrophic growth.  Dry, flaky, xerotic pedal skin in moccasin distribution, right greater than left  Assessment:   1. Pain due to onychomycosis of toenails of both feet   2. Diabetic polyneuropathy associated with type 2 diabetes mellitus (HCC)   3. Tinea pedis of both feet      Plan:  Patient was evaluated and treated and all questions answered.  # Tinea pedis -2% ketoconazole  cream sent to patient's pharmacy to be applied once a day for 3 weeks; patient reports he had been using this once a week. -Discussed importance of pedal hygiene and home care instructions for preventing recurrence of athletes foot. -Return ito clinic in 3-4 weeks if this does not improve.  #Onychomycosis with pain  -Nails palliatively debrided as below. -Educated on self-care  Procedure: Nail Debridement Rationale: Pain Type of Debridement: manual, sharp debridement. Instrumentation: Nail nipper, rotary burr. Number of Nails: 10  Patient educated  on diabetes. Discussed proper diabetic foot care and discussed risks and complications of disease. Educated patient in depth on reasons to return to the office immediately should he/she discover anything concerning or new on the feet. All questions answered. Discussed proper shoes as well.    Return in about 3 months (around 10/23/2023) for Diabetic Foot Care.         Eve Hinders, DPM [facility] / [facility]

## 2023-07-27 DIAGNOSIS — I872 Venous insufficiency (chronic) (peripheral): Secondary | ICD-10-CM | POA: Diagnosis not present

## 2023-07-27 DIAGNOSIS — Z1211 Encounter for screening for malignant neoplasm of colon: Secondary | ICD-10-CM | POA: Diagnosis not present

## 2023-07-27 DIAGNOSIS — I10 Essential (primary) hypertension: Secondary | ICD-10-CM | POA: Diagnosis not present

## 2023-07-27 DIAGNOSIS — E1169 Type 2 diabetes mellitus with other specified complication: Secondary | ICD-10-CM | POA: Diagnosis not present

## 2023-07-27 DIAGNOSIS — K909 Intestinal malabsorption, unspecified: Secondary | ICD-10-CM | POA: Diagnosis not present

## 2023-07-27 DIAGNOSIS — Z9884 Bariatric surgery status: Secondary | ICD-10-CM | POA: Diagnosis not present

## 2023-07-27 DIAGNOSIS — Z79899 Other long term (current) drug therapy: Secondary | ICD-10-CM | POA: Diagnosis not present

## 2023-07-27 DIAGNOSIS — G4733 Obstructive sleep apnea (adult) (pediatric): Secondary | ICD-10-CM | POA: Diagnosis not present

## 2023-08-03 ENCOUNTER — Other Ambulatory Visit: Payer: Self-pay | Admitting: Podiatry

## 2023-08-03 DIAGNOSIS — B353 Tinea pedis: Secondary | ICD-10-CM

## 2023-08-19 DIAGNOSIS — H52203 Unspecified astigmatism, bilateral: Secondary | ICD-10-CM | POA: Diagnosis not present

## 2023-08-19 DIAGNOSIS — H04123 Dry eye syndrome of bilateral lacrimal glands: Secondary | ICD-10-CM | POA: Diagnosis not present

## 2023-08-19 DIAGNOSIS — H31003 Unspecified chorioretinal scars, bilateral: Secondary | ICD-10-CM | POA: Diagnosis not present

## 2023-08-19 DIAGNOSIS — H2513 Age-related nuclear cataract, bilateral: Secondary | ICD-10-CM | POA: Diagnosis not present

## 2023-08-19 DIAGNOSIS — H524 Presbyopia: Secondary | ICD-10-CM | POA: Diagnosis not present

## 2023-08-19 DIAGNOSIS — E119 Type 2 diabetes mellitus without complications: Secondary | ICD-10-CM | POA: Diagnosis not present

## 2023-10-22 ENCOUNTER — Ambulatory Visit: Admitting: Podiatry

## 2023-10-22 ENCOUNTER — Encounter: Payer: Self-pay | Admitting: Podiatry

## 2023-10-22 DIAGNOSIS — B351 Tinea unguium: Secondary | ICD-10-CM

## 2023-10-22 DIAGNOSIS — E1142 Type 2 diabetes mellitus with diabetic polyneuropathy: Secondary | ICD-10-CM

## 2023-10-22 DIAGNOSIS — B353 Tinea pedis: Secondary | ICD-10-CM | POA: Diagnosis not present

## 2023-10-22 DIAGNOSIS — M79674 Pain in right toe(s): Secondary | ICD-10-CM

## 2023-10-22 DIAGNOSIS — M79675 Pain in left toe(s): Secondary | ICD-10-CM

## 2023-10-22 MED ORDER — CICLOPIROX OLAMINE 0.77 % EX CREA: TOPICAL_CREAM | Freq: Two times a day (BID) | CUTANEOUS | 0 refills | Status: AC

## 2023-10-22 NOTE — Progress Notes (Signed)
  Subjective:  Patient ID: KORAY SOTER, male    DOB: [birth year],  MRN: [id number]  Chief Complaint  Patient presents with   Diabetes    DFC  A1c 6.1.  No anti Coag     68 y.o. male presents with the above complaint. History confirmed with patient. Patient presenting with pain related to dystrophic thickened elongated nails. Patient is unable to trim own nails related to nail dystrophy and/or mobility issues. Patient does have a history of T2DM.  On the right side he has continued red flaky dry use, inconsistent use of the ketoconazole  and has run out of it as well.  Objective:  Physical Exam: warm, good capillary refill, pedal skin atrophic nail exam onychomycosis of the toenails, onycholysis, and dystrophic nails DP pulses palpable, PT pulses palpable, protective sensation absent, and vibratory sensation diminished Left Foot:  Pain with palpation of nails due to elongation and dystrophic growth.  Right Foot: Pain with palpation of nails due to elongation and dystrophic growth.  Prior right hallux total nail avulsion Dry, flaky, xerotic pedal skin in moccasin distribution, right greater than left  Assessment:   1. Pain due to onychomycosis of toenails of both feet   2. Diabetic polyneuropathy associated with type 2 diabetes mellitus (HCC)   3. Tinea pedis of both feet      Plan:  Patient was evaluated and treated and all questions answered.  # Tinea pedis, right greater than left - Seems to have been ongoing, reports intermittent use the ketoconazole  but is run out - Right foot greater than left - Will try daily washing of the feet with antibacterial soap and water and applying moisturizing agent such as Aquaphor or Vaseline - Will send ciclopirox  0.77% cream to be applied once a day if he notices skin rash appearing to worsen again but we will give moisturizing agents a chance first. - Discussed pedal hygiene in the treatment and management of tinea pedis. -I certify that this  diagnosis represents a distinct and separate diagnosis that requires evaluation and treatment separate from other procedures or diagnosis   #Onychomycosis with pain  -Nails palliatively debrided as below. -Educated on self-care  Procedure: Nail Debridement Rationale: Pain Type of Debridement: manual, sharp debridement. Instrumentation: Nail nipper, rotary burr. Number of Nails: 9  Patient educated on diabetes. Discussed proper diabetic foot care and discussed risks and complications of disease. Educated patient in depth on reasons to return to the office immediately should he/she discover anything concerning or new on the feet. All questions answered. Discussed proper shoes as well.    Return in about 3 months (around 01/21/2024) for Diabetic Foot Care.         Ethan Saddler, DPM [facility] / [facility]

## 2024-01-21 ENCOUNTER — Ambulatory Visit: Admitting: Podiatry

## 2024-01-21 ENCOUNTER — Encounter: Payer: Self-pay | Admitting: Podiatry

## 2024-01-21 DIAGNOSIS — B351 Tinea unguium: Secondary | ICD-10-CM | POA: Diagnosis not present

## 2024-01-21 DIAGNOSIS — M79674 Pain in right toe(s): Secondary | ICD-10-CM

## 2024-01-21 DIAGNOSIS — E1142 Type 2 diabetes mellitus with diabetic polyneuropathy: Secondary | ICD-10-CM

## 2024-01-21 DIAGNOSIS — L853 Xerosis cutis: Secondary | ICD-10-CM

## 2024-01-21 DIAGNOSIS — M79675 Pain in left toe(s): Secondary | ICD-10-CM | POA: Diagnosis not present

## 2024-01-21 NOTE — Progress Notes (Signed)
°  Subjective:  Patient ID: Gavin Sweeney, male    DOB: 07/31/1955,  MRN: [id number]  Chief Complaint  Patient presents with   [facility]    [facility] nail trim. No calluses.  A1c 6.5, No anti coag.    68 y.o. male presents with the above complaint. History confirmed with patient. Patient presenting with pain related to dystrophic thickened elongated nails. Patient is unable to trim own nails related to nail dystrophy and/or mobility issues. Patient does have a history of T2DM.  Athletes foot seems improved still some dry flaking skin  Objective:  Physical Exam: warm, good capillary refill, pedal skin atrophic nail exam onychomycosis of the toenails, onycholysis, and dystrophic nails DP pulses palpable, PT pulses palpable, protective sensation absent, and vibratory sensation diminished Left Foot:  Pain with palpation of nails due to elongation and dystrophic growth.  Right Foot: Pain with palpation of nails due to elongation and dystrophic growth.  Prior right hallux total nail avulsion Athlete's foot appearance appears improved overall.  Still xerotic pedal skin changes to plantar aspect.  Assessment:   1. Pain due to onychomycosis of toenails of both feet   2. Diabetic polyneuropathy associated with type 2 diabetes mellitus (HCC)      Plan:  Patient was evaluated and treated and all questions answered.  # Xerosis cutis - Athlete's foot overall appears improved - Try transitioning to over-the-counter unscented lotion such as Lubriderm or Eucerin.  May use Vaseline or Aquaphor as well if needed. - Apply 1-2 times a day especially after showering or bathing - Monitor for signs of recurrence of the athlete's foot, contact office if necessary can send in refill of topical medication.   #Onychomycosis with pain  -Nails palliatively debrided as below. -Educated on self-care  Procedure: Nail Debridement Rationale: Pain Type of Debridement: manual, sharp debridement. Instrumentation: Nail  nipper, rotary burr. Number of Nails: 9   Patient educated on diabetes. Discussed proper diabetic foot care and discussed risks and complications of disease. Educated patient in depth on reasons to return to the office immediately should he/she discover anything concerning or new on the feet. All questions answered. Discussed proper shoes as well.     Return in about 3 months (around 04/20/2024) for Diabetic Foot Care.         Ethan Saddler, DPM [facility] / [facility]

## 2024-04-21 ENCOUNTER — Ambulatory Visit: Admitting: Podiatry
